# Patient Record
Sex: Male | Born: 1940 | Race: White | Hispanic: No | Marital: Single | Smoking: Former smoker
Health system: Southern US, Community
[De-identification: ages and names within clinical notes are randomized; demographics above are authoritative.]

## PROBLEM LIST (undated history)

## (undated) DIAGNOSIS — D649 Anemia, unspecified: Secondary | ICD-10-CM

## (undated) DIAGNOSIS — K219 Gastro-esophageal reflux disease without esophagitis: Secondary | ICD-10-CM

## (undated) DIAGNOSIS — E785 Hyperlipidemia, unspecified: Secondary | ICD-10-CM

## (undated) DIAGNOSIS — J449 Chronic obstructive pulmonary disease, unspecified: Secondary | ICD-10-CM

## (undated) DIAGNOSIS — G709 Myoneural disorder, unspecified: Secondary | ICD-10-CM

## (undated) DIAGNOSIS — F419 Anxiety disorder, unspecified: Secondary | ICD-10-CM

## (undated) DIAGNOSIS — I639 Cerebral infarction, unspecified: Secondary | ICD-10-CM

## (undated) DIAGNOSIS — I10 Essential (primary) hypertension: Secondary | ICD-10-CM

## (undated) HISTORY — DX: Anxiety disorder, unspecified: F41.9

## (undated) HISTORY — PX: NECK SURGERY: SHX720

## (undated) HISTORY — DX: Myoneural disorder, unspecified: G70.9

## (undated) HISTORY — DX: Hyperlipidemia, unspecified: E78.5

## (undated) HISTORY — PX: COLONOSCOPY: SHX174

## (undated) HISTORY — DX: Gastro-esophageal reflux disease without esophagitis: K21.9

## (undated) HISTORY — DX: Cerebral infarction, unspecified: I63.9

## (undated) HISTORY — PX: POLYPECTOMY: SHX149

## (undated) HISTORY — DX: Anemia, unspecified: D64.9

## (undated) HISTORY — DX: Essential (primary) hypertension: I10

## (undated) HISTORY — DX: Chronic obstructive pulmonary disease, unspecified: J44.9

## (undated) HISTORY — PX: KNEE SURGERY: SHX244

---

## 2013-01-10 ENCOUNTER — Institutional Professional Consult (permissible substitution): Payer: Self-pay | Admitting: Emergency Medicine

## 2013-01-16 ENCOUNTER — Ambulatory Visit (INDEPENDENT_AMBULATORY_CARE_PROVIDER_SITE_OTHER)
Admission: RE | Admit: 2013-01-16 | Discharge: 2013-01-16 | Disposition: A | Discharge status: Home / Self Care | Payer: Medicare Other | Source: Ambulatory Visit | Attending: Internal Medicine | Admitting: Internal Medicine

## 2013-01-16 ENCOUNTER — Ambulatory Visit (INDEPENDENT_AMBULATORY_CARE_PROVIDER_SITE_OTHER): Payer: Medicare Other | Admitting: Internal Medicine

## 2013-01-16 ENCOUNTER — Other Ambulatory Visit (INDEPENDENT_AMBULATORY_CARE_PROVIDER_SITE_OTHER): Payer: Medicare Other

## 2013-01-16 ENCOUNTER — Encounter: Payer: Self-pay | Admitting: Internal Medicine

## 2013-01-16 VITALS — BP 142/68 | HR 108 | Temp 97.9°F | Ht 73.0 in | Wt 216.0 lb

## 2013-01-16 DIAGNOSIS — E785 Hyperlipidemia, unspecified: Secondary | ICD-10-CM

## 2013-01-16 DIAGNOSIS — D509 Iron deficiency anemia, unspecified: Secondary | ICD-10-CM

## 2013-01-16 LAB — BASIC METABOLIC PANEL
CO2: 24 mEq/L (ref 19–32)
Chloride: 106 mEq/L (ref 96–112)
Glucose, Bld: 101 mg/dL — ABNORMAL HIGH (ref 70–99)
Potassium: 4.5 mEq/L (ref 3.5–5.1)
Sodium: 138 mEq/L (ref 135–145)

## 2013-01-16 LAB — CBC WITH DIFFERENTIAL/PLATELET
Eosinophils Relative: 2.3 % (ref 0.0–5.0)
HCT: 30.6 % — ABNORMAL LOW (ref 39.0–52.0)
Lymphs Abs: 1.9 10*3/uL (ref 0.7–4.0)
Monocytes Relative: 10 % (ref 3.0–12.0)
Platelets: 219 10*3/uL (ref 150.0–400.0)
WBC: 6.8 10*3/uL (ref 4.5–10.5)

## 2013-01-16 NOTE — Progress Notes (Signed)
  Subjective:    Patient ID: Nicholas Buck, male    DOB: 1941/04/30   MRN: [id number]  HPI   77 yowm quit smoking summer 2013 with dx of copd by Chodri self-referred to pulmonary clinic with progressive decline in activity tol since quit smoking.   01/16/2013 1st pulmonary eval cc progressive doe assoc wt gain 190 to 216  previously able to play 18 holes indolent onset progressively worse to point where can't do one do one hold and doe x 100 ft with leg and arm pain and fatigue and sense of pnds to point of sometimes choking esp at hs but then able to sleep all night.  No obvious daytime variabilty or assoc chronic cough or cp or chest tightness, subjective wheeze overt sinus or hb symptoms. No unusual exp hx or h/o childhood pna/ asthma or premature birth to his knowledge.   Also bothered by ? Peripheral neuropathy   Review of Systems  Constitutional: Positive for unexpected weight change. Negative for fever.  HENT: Positive for congestion. Negative for ear pain, nosebleeds, sore throat, rhinorrhea, sneezing, trouble swallowing, dental problem, postnasal drip and sinus pressure.   Eyes: Negative for redness and itching.  Respiratory: Positive for shortness of breath. Negative for cough, chest tightness and wheezing.   Cardiovascular: Negative for palpitations and leg swelling.  Gastrointestinal: Negative for nausea and vomiting.  Genitourinary: Negative for dysuria.  Musculoskeletal: Negative for joint swelling.  Skin: Negative for rash.  Neurological: Negative for headaches.  Hematological: Does not bruise/bleed easily.  Psychiatric/Behavioral: Negative for dysphoric mood. The patient is not nervous/anxious.        Objective:   Physical Exam Wt Readings from Last 3 Encounters:  01/16/13 216 lb (97.977 kg)    HEENT mild turbinate edema.  Oropharynx clear edentulous with dentures in place no thrush or excess pnd or cobblestoning.  No JVD or cervical adenopathy. Mild accessory muscle  hypertrophy. Trachea midline, nl thryroid. Chest was hyperinflated by percussion with diminished breath sounds and moderate increased exp time without wheeze. Hoover sign positive at mid inspiration. Regular rate and rhythm without murmur gallop or rub or increase P2 or edema.  Abd: no hsm, nl excursion. Ext warm without cyanosis or clubbing.    CXR  01/16/2013 :   COPD/chronic changes.  Labs 01/16/23:  Hct 30% with microcytic indices       Assessment & Plan:

## 2013-01-16 NOTE — Patient Instructions (Addendum)
Try off the cholesterol pill for at least a couple of weeks  Baby aspirin one daily with breakfast  Stop spiriva  Only use the nebulizer if having a bad day with breathing to see if it turns a bad day into a good one  Please remember to go to the lab and x-ray department downstairs for your tests - we will call you with the results when they are available.  Try prilosec 20mg   Take 30-60 min before first meal of the day and Pepcid 20 mg one bedtime   GERD (REFLUX)  is an extremely common cause of respiratory symptoms just like yours, many times with no significant heartburn at all.    It can be treated with medication, but also with lifestyle changes including avoidance of late meals, excessive alcohol, smoking cessation, and avoid fatty foods, chocolate, peppermint, colas, red wine, and acidic juices such as orange juice.  NO MINT OR MENTHOL PRODUCTS SO NO COUGH DROPS  USE SUGARLESS CANDY INSTEAD (jolley ranchers or Stover's)  NO OIL BASED VITAMINS - use powdered substitutes.      Please schedule a follow up office visit in 4 weeks, sooner if needed   You are cleared to participate in rehab Late add:  Cancel rehab until sort out anemia

## 2013-01-17 DIAGNOSIS — D509 Iron deficiency anemia, unspecified: Secondary | ICD-10-CM | POA: Insufficient documentation

## 2013-01-17 NOTE — Assessment & Plan Note (Signed)
Microcytic anemia worrisome for underlying GI ca and does already have some symptoms suggestive of GERD so will likely need full GI w/u

## 2013-01-17 NOTE — Assessment & Plan Note (Signed)
-   Spirometry 01/16/13 FEV1  3.79 (102%) ratio 65 -01/17/2013  Walked RA x 3 laps @ 185 ft each stopped due to end of study, mild sob with sats still 90%  He only has GOLD I severity - Symptoms are markedly disproportionate to objective findings and not clear this is a lung problem but pt does appear to have difficult airway management issues.   Adherence is always the initial "prime suspect" and is a multilayered concern that requires a "trust but verify" approach in every patient - starting with knowing how to use medications, especially inhalers, correctly, keeping up with refills and understanding the fundamental difference between maintenance and prns vs those medications only taken for a very short course and then stopped and not refilled. Clearly not using spiriva correctly and probably not helping anyway so d/c it and just use duoneb prn (See instructions for specific recommendations which were reviewed directly with the patient who was given a copy with highlighter outlining the key components. )  ? Acid reflux suggested by the choking symptoms> max rx plus diet reviewed

## 2013-01-20 ENCOUNTER — Other Ambulatory Visit: Payer: Self-pay | Admitting: Internal Medicine

## 2013-01-20 NOTE — Progress Notes (Signed)
Quick Note:  Spoke with pt and notified of results per Dr. Wert. Pt verbalized understanding and denied any questions.  ______ 

## 2013-01-21 ENCOUNTER — Encounter: Payer: Self-pay | Admitting: Internal Medicine

## 2013-01-21 ENCOUNTER — Other Ambulatory Visit: Payer: Self-pay | Admitting: Internal Medicine

## 2013-01-21 ENCOUNTER — Encounter (HOSPITAL_COMMUNITY): Admission: RE | Admit: 2013-01-21 | Payer: Medicare Other | Source: Ambulatory Visit

## 2013-01-21 ENCOUNTER — Telehealth: Payer: Self-pay | Admitting: Internal Medicine

## 2013-01-21 ENCOUNTER — Ambulatory Visit (INDEPENDENT_AMBULATORY_CARE_PROVIDER_SITE_OTHER): Payer: Medicare Other | Admitting: Internal Medicine

## 2013-01-21 ENCOUNTER — Other Ambulatory Visit (INDEPENDENT_AMBULATORY_CARE_PROVIDER_SITE_OTHER): Payer: Medicare Other

## 2013-01-21 ENCOUNTER — Other Ambulatory Visit: Payer: Medicare Other

## 2013-01-21 VITALS — BP 124/70 | HR 101 | Temp 97.7°F | Ht 74.0 in | Wt 215.0 lb

## 2013-01-21 DIAGNOSIS — D62 Acute posthemorrhagic anemia: Secondary | ICD-10-CM

## 2013-01-21 DIAGNOSIS — J449 Chronic obstructive pulmonary disease, unspecified: Secondary | ICD-10-CM

## 2013-01-21 DIAGNOSIS — D649 Anemia, unspecified: Secondary | ICD-10-CM

## 2013-01-21 DIAGNOSIS — R0602 Shortness of breath: Secondary | ICD-10-CM

## 2013-01-21 DIAGNOSIS — D509 Iron deficiency anemia, unspecified: Secondary | ICD-10-CM

## 2013-01-21 DIAGNOSIS — R0600 Dyspnea, unspecified: Secondary | ICD-10-CM

## 2013-01-21 DIAGNOSIS — R002 Palpitations: Secondary | ICD-10-CM

## 2013-01-21 LAB — IBC PANEL
Saturation Ratios: 5.2 % — ABNORMAL LOW (ref 20.0–50.0)
Transferrin: 370.4 mg/dL — ABNORMAL HIGH (ref 212.0–360.0)

## 2013-01-21 LAB — CBC
Platelets: 226 10*3/uL (ref 150.0–400.0)
RBC: 4.19 Mil/uL — ABNORMAL LOW (ref 4.22–5.81)
WBC: 6.7 10*3/uL (ref 4.5–10.5)

## 2013-01-21 NOTE — Telephone Encounter (Signed)
We can reach the son at [phone]. The pt and son would like to know if there is anything the pt should do if he starts having the weakness  or dizziness again while waiting on appt with GI on 02/05/13. Should he be taking any vitamins or eating a special diet? Also, is his symptoms and blood loss related or could this be two separate issues? MW, pls advise.

## 2013-01-21 NOTE — Progress Notes (Signed)
Quick Note:  Spoke with pt and notified of results per Dr. Wert. Pt verbalized understanding and denied any questions.  ______ 

## 2013-01-21 NOTE — Telephone Encounter (Signed)
Notes Recorded by Nyoka Cowden, MD on 01/21/2013 at 2:47 PM Call patient : Study is not changed, so no change in recs from ov      ATC NA, and mailbox full so unable to leave a msg, [facility]

## 2013-01-21 NOTE — Assessment & Plan Note (Addendum)
-     01/21/2013   Occult stools POS ' - Fe Sat 5% > refer to GI and d/c asa/ continue max acid suppression

## 2013-01-21 NOTE — Assessment & Plan Note (Signed)
-   Spirometry 01/16/13 FEV1  3.79 (102%) ratio 65 -01/17/2013  Walked RA x 3 laps @ 185 ft each stopped due to end of study, mild sob with sats still 90%  Sob likely related to anemia and anxiety but no need for further saba rx > rec  use only in emergency

## 2013-01-21 NOTE — Telephone Encounter (Signed)
ATC x 1. Mailbox full. WCB.

## 2013-01-21 NOTE — Telephone Encounter (Signed)
Pt and son aware of MW recs and verbalized understanding. They will call if any of the pt's symptoms change or become worse.

## 2013-01-21 NOTE — Telephone Encounter (Signed)
This is a chronic stable  problem and off asa should be much better over several weeks if continues gerd rx and would not rx with any vitamins or iron as it will interfere with w/u

## 2013-01-21 NOTE — Telephone Encounter (Signed)
Pt's son returned call.  He can be reached @ same # we called before. Nicholas Buck

## 2013-01-21 NOTE — Patient Instructions (Addendum)
If condition worsens you will need to go directly to ER  You appear to have anemia due to possible blood loss so at this point should stop your aspirin and ok restart cholesterol pill unless you notice the aches and pains in shoulders recur in which case you should stop   Please see patient coordinator before you leave today  to schedule GI referral  Please remember to go to the lab   department downstairs for your tests - we will call you with the results when they are available. Late add :  Cancel stool cards as G pos in office

## 2013-01-21 NOTE — Progress Notes (Signed)
Subjective:    Patient ID: Nicholas Buck, male    DOB: July 28, 1941   MRN: [id number]  HPI   67 yowm quit smoking summer 2013 with dx of copd by Chodri self-referred to pulmonary clinic with progressive decline in activity tol since quit smoking.   01/16/2013 1st pulmonary eval cc progressive doe assoc wt gain 190 to 216  previously able to play 18 holes indolent onset progressively worse to point where can't do one do one hole and doe x 100 ft with leg and arm pain and fatigue and sense of pnds to point of sometimes choking esp at hs but then able to sleep all night. rec Try off the cholesterol pill for at least a couple of weeks Baby aspirin one daily with breakfast Stop spiriva Only use the nebulizer if having a bad day with breathing to see if it turns a bad day into a good one Please remember to go to the lab and x-ray department downstairs for your tests - we will call you with the results when they are available. Try prilosec 20mg   Take 30-60 min before first meal of the day and Pepcid 20 mg one bedtime  GERD diet Labs :  Pos Microcyctic anemia.    01/21/2013  Acute w/in  ov/Princesa Willig cc acute onset feeling shaky p neb use around 2 h prior to OV  For blood draw for fe levels so seen as acute w/in. No cp - arm discomfort better off statin, still taking baby asa with no obvious gi or other blood loss.  No sob at rest   No obvious daytime variabilty or assoc chronic cough or cp or chest tightness, subjective wheeze overt sinus or hb symptoms. No unusual exp hx or h/o childhood pna/ asthma or premature birth to his knowledge.   Sleeping ok without nocturnal  or early am exacerbation  of respiratory  c/o's or need for noct saba. Also denies any obvious fluctuation of symptoms with weather or environmental changes or other aggravating or alleviating factors except as outlined above   ROS  The following are not active complaints unless bolded sore throat, dysphagia, dental problems, itching,  sneezing,  nasal congestion or excess/ purulent secretions, ear ache,   fever, chills, sweats, unintended wt loss, pleuritic or exertional cp, hemoptysis,  orthopnea pnd or leg swelling, presyncope, palpitations, heartburn, abdominal pain, anorexia, nausea, vomiting, diarrhea  or change in bowel or urinary habits, change in stools or urine, dysuria,hematuria,  rash, arthralgias, visual complaints, headache, numbness weakness or ataxia or problems with walking or coordination,  change in mood/affect or memory.              Objective:   Physical Exam Wt 01/21/2013  215 Wt Readings from Last 3 Encounters:  01/16/13 216 lb (97.977 kg)    HEENT mild turbinate edema.  Oropharynx clear edentulous with dentures in place no thrush or excess pnd or cobblestoning.  No JVD or cervical adenopathy. Mild accessory muscle hypertrophy. Trachea midline, nl thryroid. Chest was hyperinflated by percussion with diminished breath sounds and moderate increased exp time without wheeze. Hoover sign positive at mid inspiration. Regular rate and rhythm without murmur gallop or rub or increase P2 or edema.  Abd: no hsm, nl excursion. Ext warm without cyanosis or clubbing.  Rectal POS OCCULT BLOOD, no mass or prostate nodule  CXR  01/16/2013 :   COPD/chronic changes.  Labs 01/16/23:  Hct 30% with microcytic indices Repeat 01/21/2013 : Hct still 30%, Fesat 5.2%  01/21/2013 EKG nsr/ wnl    Assessment & Plan:

## 2013-01-22 ENCOUNTER — Other Ambulatory Visit (INDEPENDENT_AMBULATORY_CARE_PROVIDER_SITE_OTHER): Payer: Medicare Other

## 2013-01-22 ENCOUNTER — Telehealth: Payer: Self-pay | Admitting: Internal Medicine

## 2013-01-22 DIAGNOSIS — R0602 Shortness of breath: Secondary | ICD-10-CM

## 2013-01-22 NOTE — Telephone Encounter (Signed)
Spoke with pt's son He had multiple questions for MW- what is his prognosis with his "lung problem"  What should he do while waiting to see GI to prevent any further blood loss and why is he losing so much blood?? I advised that we stopped his asa and dare not sure about what the cause is and this is the reason for GI referral  He does not understand why we are not giving any other recs and wants MW to call him Please call him or advise thanks!

## 2013-01-22 NOTE — Telephone Encounter (Signed)
Discussed with pt by phone - he's feeling better today and would prefer [place] not to discuss details of his case with his son in pt's absence

## 2013-01-22 NOTE — Progress Notes (Signed)
Quick Note:  Pt aware of these results ______

## 2013-01-23 ENCOUNTER — Ambulatory Visit (HOSPITAL_COMMUNITY): Payer: Self-pay

## 2013-01-28 ENCOUNTER — Ambulatory Visit (HOSPITAL_COMMUNITY): Payer: Self-pay

## 2013-01-30 ENCOUNTER — Ambulatory Visit (HOSPITAL_COMMUNITY): Payer: Self-pay

## 2013-02-04 ENCOUNTER — Ambulatory Visit (HOSPITAL_COMMUNITY): Payer: Self-pay

## 2013-02-05 ENCOUNTER — Ambulatory Visit (INDEPENDENT_AMBULATORY_CARE_PROVIDER_SITE_OTHER): Payer: Medicare Other | Admitting: Internal Medicine

## 2013-02-05 ENCOUNTER — Encounter: Payer: Self-pay | Admitting: Internal Medicine

## 2013-02-05 VITALS — BP 140/70 | HR 109 | Ht 72.6 in | Wt 217.0 lb

## 2013-02-05 MED ORDER — FERROUS SULFATE 325 (65 FE) MG PO TBEC: 325.0000 mg | DELAYED_RELEASE_TABLET | Freq: Three times a day (TID) | ORAL | Status: DC

## 2013-02-05 MED ORDER — MOVIPREP 100 G PO SOLR: 1.0000 | Freq: Once | ORAL | Status: DC

## 2013-02-05 NOTE — Progress Notes (Signed)
HISTORY OF PRESENT ILLNESS:  Nicholas Buck is a 72 y.o. male with hyperlipidemia who is sent today for evaluation regarding iron deficiency anemia and Hemoccult-positive stool. Patient reports that he was in his usual state of health until last summer when he developed progressive dyspnea on exertion. He was seen elsewhere in diagnosed with COPD. Subsequently evaluated by our pulmonary team and it was decided that he did not have significant COPD. However, he was noted to be significantly anemic with hematocrit of 30%, microcytic indices, and iron saturation of 5.2%. Rectal examination revealed Hemoccult-positive stool. He was empirically placed on PPI and H2 receptor antagonists. He quit smoking. He denies melena or hematochezia. GI review of systems negative except for weight gain. The patient states that he had colonoscopy in 2010 and was told that he has polyps. He says this was his second colonoscopy and it was recommended for followup in 5 years. No records available at the time of his visit. Subsequently, records were obtained and summarized as follows: All by Dr. Marcial Pacas Misenheimer: Colonoscopy 01/18/2007. Significant sigmoid colon polyp removed piecemeal in the sigmoid colon with tattooing (hyperplastic) an additional 6 diminutive polyps. 3 tubular adenomas and hyperplastic polyps noted. Colonoscopy 01/28/2008 revealing redundant tissue at the polypectomy site as well as 10 diminutive polyps and diverticular disease. Pathology revealed hyperplastic polyps and no pathologic diagnosis. No adenomatous tissue. Colonoscopy 06/01/2009 to evaluate iron deficiency anemia. 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 Polyp was hyperplastic. EGD 06/01/2009 was normal except for diminutive gastric polyp which was unremarkable on biopsy. Duodenal biopsies were normal. He does take a daily aspirin and rarely uses  NSAIDs  REVIEW OF SYSTEMS:  All non-GI ROS negative except for fatigue, exertional shortness of breath, excessive urination, back pain  Past Medical History  Diagnosis Date  . Anemia     microcytic  . COPD (chronic obstructive pulmonary disease)     ?  Marland Kitchen Hyperlipidemia   . GERD (gastroesophageal reflux disease)     History reviewed. No pertinent past surgical history.  Social History Nicholas Buck  reports that he has quit smoking. His smoking use included Cigarettes. He started smoking about 7 months ago. He has a 50 pack-year smoking history. He has never used smokeless tobacco. He reports that  drinks alcohol. He reports that he does not use illicit drugs.  family history is not on file.  No Known Allergies     PHYSICAL EXAM INATION: Vital signs: BP 140/70  Pulse 109  Ht 6' 0.6" (1.844 m)  Wt 217 lb (98.431 kg)  BMI 28.95 kg/m2  SpO2 91%  Constitutional: generally well-appearing, no acute distress Psychiatric: alert and oriented x3, cooperative Eyes: extraocular movements intact, anicteric, conjunctiva pink Mouth: oral pharynx moist, no lesions Neck: supple no lymphadenopathy Cardiovascular: heart regular rate and rhythm, no murmur Lungs: clear to auscultation bilaterally Abdomen: soft, nontender, nondistended, no obvious ascites, no peritoneal signs, normal bowel sounds, no organomegaly Rectal: Deferred until colonoscopy Extremities: no lower extremity edema bilaterally Skin: no lesions on visible extremities Neuro: No focal deficits. No asterixis.    ASSESSMENT:  #1. Iron deficiency anemia. History of the same. Suspect AVMs #2. Significant history of polyposis. Last colonoscopy 2010 #3. Prior upper endoscopy with gastric polyp. Now empirically On PPI   PLAN:  #1. Iron sulfate 325 mg 3 times a day. Warned about darkening stools and constipation #2. Colonoscopy and upper endoscopy.The nature of the procedure,  as well as the risks, benefits, and  alternatives were carefully and thoroughly reviewed with the patient. Ample time for discussion and questions allowed. The patient understood, was satisfied, and agreed to proceed. #3. Hold iron 5-7 days prior to procedure #4. Continue PPI for now #5. Movi prep prescribed. Patient instructed on its use.

## 2013-02-05 NOTE — Patient Instructions (Addendum)
You have been scheduled for an endoscopy and colonoscopy with propofol. Please follow the written instructions given to you at your visit today. Please pick up your prep at the pharmacy within the next 1-3 days. If you use inhalers (even only as needed), please bring them with you on the day of your procedure.   We have sent the following medications to your pharmacy for you to pick up at your convenience:  Iron Sulfate  Please stop taking the Iron 5 days prior to your procedure

## 2013-02-06 ENCOUNTER — Ambulatory Visit (HOSPITAL_COMMUNITY): Payer: Self-pay

## 2013-02-11 ENCOUNTER — Ambulatory Visit (HOSPITAL_COMMUNITY): Payer: Self-pay

## 2013-02-13 ENCOUNTER — Ambulatory Visit (HOSPITAL_COMMUNITY): Payer: Self-pay

## 2013-02-13 ENCOUNTER — Ambulatory Visit (INDEPENDENT_AMBULATORY_CARE_PROVIDER_SITE_OTHER): Payer: Medicare Other | Admitting: Internal Medicine

## 2013-02-13 ENCOUNTER — Encounter: Payer: Self-pay | Admitting: Internal Medicine

## 2013-02-13 VITALS — BP 130/80 | HR 105 | Temp 97.1°F | Ht 74.0 in | Wt 214.0 lb

## 2013-02-13 NOTE — Assessment & Plan Note (Addendum)
-   Spirometry 01/16/13 FEV1  3.79 (102%) ratio 65 -01/17/2013  Walked RA x 3 laps @ 185 ft each stopped due to end of study, mild sob with sats still 90%  I had an extended summary discussion with the patient today lasting 15 to 20 minutes of a 25 minute visit on the following issues:   As I explained to this patient in detail:  although there may be significant copd present, it does not appear to be limiting activity tolerance any more than a set of worn tires limits someone from driving a car  around a parking lot.  A new set of Michelins might look good but would have no perceived impact on the performance of the car and would not be worth the cost. At this point he is limited by anemia and deconditioning but is improving with rx of anemia.  We can certainly proceed with a cpst p anemia is corrected if pt not convinced his breathing is completely back to baseline but no further f/u needed at this point

## 2013-02-13 NOTE — Progress Notes (Signed)
Subjective:    Patient ID: Nicholas Buck, male    DOB: Mar 08, 1941   MRN: [id number]     Brief patient profile:  71 yowm quit smoking summer 2013 with dx of copd by Chodri self-referred to pulmonary clinic with progressive decline in activity tol since quit smoking.   01/16/2013 1st pulmonary eval cc progressive doe assoc wt gain 190 to 216  previously able to play 18 holes indolent onset progressively worse to point where can't do one do one hole and doe x 100 ft with leg and arm pain and fatigue and sense of pnds to point of sometimes choking esp at hs but then able to sleep all night. rec Try off the cholesterol pill for at least a couple of weeks Baby aspirin one daily with breakfast Stop spiriva Only use the nebulizer if having a bad day with breathing to see if it turns a bad day into a good one Please remember to go to the lab and x-ray department downstairs for your tests - we will call you with the results when they are available. Try prilosec 20mg   Take 30-60 min before first meal of the day and Pepcid 20 mg one bedtime  GERD diet Labs :  Pos Microcyctic anemia.    01/21/2013  Acute w/in  ov/Logen Heintzelman cc acute onset feeling shaky p neb use around 2 h prior to OV  For blood draw for fe levels so seen as acute w/in. No cp - arm discomfort better off statin, still taking baby asa with no obvious gi or other blood loss.  No sob at rest  02/13/2013 f/u ov/Doyle Kunath cc breathing a little better on Fe rx and not using any inhalers or nebs or dizzy spells.  No obvious daytime variabilty or assoc chronic cough or cp or chest tightness, subjective wheeze overt sinus or hb symptoms. No unusual exp hx or h/o childhood pna/ asthma or premature birth to his knowledge.   Sleeping ok without nocturnal  or early am exacerbation  of respiratory  c/o's or need for noct saba. Also denies any obvious fluctuation of symptoms with weather or environmental changes or other aggravating or alleviating factors except as  outlined above   ROS  The following are not active complaints unless bolded sore throat, dysphagia, dental problems, itching, sneezing,  nasal congestion or excess/ purulent secretions, ear ache,   fever, chills, sweats, unintended wt loss, pleuritic or exertional cp, hemoptysis,  orthopnea pnd or leg swelling, presyncope, palpitations, heartburn, abdominal pain, anorexia, nausea, vomiting, diarrhea  or change in bowel or urinary habits, change in stools or urine, dysuria,hematuria,  rash, arthralgias, visual complaints, headache, numbness weakness or ataxia or problems with walking or coordination,  change in mood/affect or memory.              Objective:   Physical Exam Wt 01/21/2013  215 Wt Readings from Last 3 Encounters:  01/16/13 216 lb (97.977 kg)    HEENT mild turbinate edema.  Oropharynx clear edentulous with dentures in place no thrush or excess pnd or cobblestoning.  No JVD or cervical adenopathy. Mild accessory muscle hypertrophy. Trachea midline, nl thryroid. Chest was hyperinflated by percussion with diminished breath sounds and moderate increased exp time without wheeze. Hoover sign positive at mid inspiration. Regular rate and rhythm without murmur gallop or rub or increase P2 or edema.  Abd: no hsm, nl excursion. Ext warm without cyanosis Pos moderate clubbing.  Rectal POS OCCULT BLOOD, no mass or prostate nodule  CXR  01/16/2013 :   COPD/chronic changes.  Labs 01/16/23:  Hct 30% with microcytic indices Repeat 01/21/2013 : Hct still 30%, Fesat 5.2%   01/21/2013 EKG nsr/ wnl    Assessment & Plan:

## 2013-02-13 NOTE — Patient Instructions (Addendum)
If condition worsens you will need to go directly to ER  You appear to have anemia due to possible blood loss so at this point should continue off  your aspirin and ok restart cholesterol pill unless you notice the aches and pains in shoulders recur in which case you should stop permanently  . If you are satisfied with your treatment plan let your doctor know and he/she can either refill your medications or you can return here when your prescription runs out.     If in any way you are not 100% satisfied,  please tell us.  If 100% better, tell your friends!   If not satisfied the next step after correct your anemia is to schedule a cpst - call Libby [phone] to schedule.

## 2013-02-13 NOTE — Assessment & Plan Note (Addendum)
-    01/21/2013   Occult stools POS at ov -  01/21/2013  Fe Sat 5% > refer to GI> seen 3/12 >dx prob avms, egd/colonoscopy planned   Lab Results  Component Value Date   HGB 9.9* 01/21/2013

## 2013-02-15 DIAGNOSIS — E785 Hyperlipidemia, unspecified: Secondary | ICD-10-CM | POA: Insufficient documentation

## 2013-02-15 NOTE — Assessment & Plan Note (Signed)
Aches are gone off statins but not clear if cause and effect > rec rechallenge with statins and f/u Dr Annell Greening

## 2013-02-18 ENCOUNTER — Ambulatory Visit (HOSPITAL_COMMUNITY): Payer: Self-pay

## 2013-02-20 ENCOUNTER — Ambulatory Visit (HOSPITAL_COMMUNITY): Payer: Self-pay

## 2013-02-25 ENCOUNTER — Ambulatory Visit (HOSPITAL_COMMUNITY): Payer: Self-pay

## 2013-02-26 ENCOUNTER — Ambulatory Visit (AMBULATORY_SURGERY_CENTER): Payer: Medicare Other | Admitting: Internal Medicine

## 2013-02-26 ENCOUNTER — Encounter: Payer: Self-pay | Admitting: Internal Medicine

## 2013-02-26 VITALS — BP 144/91 | HR 74 | Temp 98.2°F | Resp 20 | Ht 72.0 in | Wt 217.0 lb

## 2013-02-26 DIAGNOSIS — D509 Iron deficiency anemia, unspecified: Secondary | ICD-10-CM

## 2013-02-26 DIAGNOSIS — D126 Benign neoplasm of colon, unspecified: Secondary | ICD-10-CM

## 2013-02-26 DIAGNOSIS — Z860100 Personal history of colon polyps, unspecified: Secondary | ICD-10-CM

## 2013-02-26 DIAGNOSIS — R195 Other fecal abnormalities: Secondary | ICD-10-CM

## 2013-02-26 MED ORDER — SODIUM CHLORIDE 0.9 % IV SOLN: 500.0000 mL | INTRAVENOUS | Status: DC

## 2013-02-26 NOTE — Progress Notes (Signed)
Called to room to assist during endoscopic procedure.  Patient ID and intended procedure confirmed with present staff. Received instructions for my participation in the procedure from the performing physician.  

## 2013-02-26 NOTE — Op Note (Signed)
 [facility] [address] [place], [address]   COLONOSCOPY PROCEDURE REPORT  PATIENT: Nicholas Buck, Nicholas Buck  MR#: [id number] BIRTHDATE: 1941-04-01 , 71  yrs. old GENDER: Male ENDOSCOPIST: Roxy Cedar, MD REFERRED XB:JYNWGNF Denice Paradise, M.D. PROCEDURE DATE:  02/26/2013 PROCEDURE:   Colonoscopy with snare polypectomy  x 2 ASA CLASS:   Class II INDICATIONS:Iron Deficiency Anemia, heme-positive stool, and Patient's personal history of adenomatous colon polyps.Elsewhere 2008, 2009, 2010 MEDICATIONS: MAC sedation, administered by CRNA and propofol (Diprivan) 200mg  IV  DESCRIPTION OF PROCEDURE:   After the risks benefits and alternatives of the procedure were thoroughly explained, informed consent was obtained.  A digital rectal exam revealed no abnormalities of the rectum.   The LB CF-H180AL [id number]  endoscope was introduced through the anus and advanced to the cecum, which was identified by both the appendix and ileocecal valve. No adverse events experienced.   The quality of the prep was excellent, using MoviPrep  The instrument was then slowly withdrawn as the colon was fully examined.      COLON FINDINGS: Two diminutive polyps were found in the ascending colon.  A polypectomy was performed with a cold snare.  The resection was complete and the polyp tissue was completely retrieved.   Moderate diverticulosis was noted The finding was in the left colon. Previously placed marking tattoo in the sigmoid colon at 25 cm noted.  The colon mucosa was otherwise normal. No obvious AVMs.  Retroflexed views revealed internal hemorrhoids. The time to cecum=2 minutes 13 seconds.  Withdrawal time=10 minutes 42 seconds.  The scope was withdrawn and the procedure completed. COMPLICATIONS: There were no complications.  ENDOSCOPIC IMPRESSION: 1.   Two diminutive polyps were found in the ascending colon; polypectomy was performed with a cold snare 2.   Moderate diverticulosis was  noted in the left colon 3.   The colon mucosa was otherwise normal  RECOMMENDATIONS: 1.  Follow up colonoscopy in 5 years 2.  Upper endoscopy today (see report) 3. Stay on iron therapy indefinitely. Had your blood counts checkedin the next week or so.   eSigned:  Roxy Cedar, MD 02/26/2013 4:22 PM   cc: The Patient and Nyoka Cowden, MD   PATIENT NAME:  [name], [name] MR#: [id number]

## 2013-02-26 NOTE — Patient Instructions (Addendum)

## 2013-02-26 NOTE — Progress Notes (Signed)
NO EGG OR SOY ALLERGY. EWM 

## 2013-02-26 NOTE — Progress Notes (Signed)
Patient did not experience any of the following events: a burn prior to discharge; a fall within the facility; wrong site/side/patient/procedure/implant event; or a hospital transfer or hospital admission upon discharge from the facility. (G8907) Patient did not have preoperative order for IV antibiotic SSI prophylaxis. (G8918)  

## 2013-02-26 NOTE — Op Note (Signed)
 [facility] [address] [place], [address]   ENDOSCOPY PROCEDURE REPORT  PATIENT: Nicholas Buck, Nicholas Buck  MR#: [id number] BIRTHDATE: [birth year] , 71  yrs. old GENDER: Male ENDOSCOPIST: Roxy Cedar, MD REFERRED BY:  Nyoka Cowden, M.D. PROCEDURE DATE:  02/26/2013 PROCEDURE:  EGD, diagnostic ASA CLASS:     Class II INDICATIONS:  Iron deficiency anemia.   Heme positive stool. MEDICATIONS: MAC sedation, administered by CRNA and propofol (Diprivan) 100mg  IV TOPICAL ANESTHETIC: none  DESCRIPTION OF PROCEDURE: After the risks benefits and alternatives of the procedure were thoroughly explained, informed consent was obtained.  The LB GIF-H180 [id number] endoscope was introduced through the mouth and advanced to the third portion of the duodenum. Without limitations.  The instrument was slowly withdrawn as the mucosa was fully examined.      The upper, middle and distal third of the esophagus were carefully inspected and no abnormalities were noted.  The z-line was well seen at the GEJ.  The endoscope was pushed into the fundus which was normal including a retroflexed view.  The antrum, gastric body, first and second part of the duodenum were unremarkable. Retroflexed views revealed no abnormalities.     The scope was then withdrawn from the patient and the procedure completed.  COMPLICATIONS: There were no complications. ENDOSCOPIC IMPRESSION: 1.Normal EGD  RECOMMENDATIONS: 1.Continue iron indefinitely 2. Have your primary provider followup for blood counts  REPEAT EXAM:  eSigned:  Roxy Cedar, MD 02/26/2013 4:28 PM   XB:JYNWGNF Denice Paradise, MD and The Patient

## 2013-02-27 ENCOUNTER — Telehealth: Payer: Self-pay | Admitting: *Deleted

## 2013-02-27 ENCOUNTER — Ambulatory Visit (HOSPITAL_COMMUNITY): Payer: Self-pay

## 2013-02-27 NOTE — Telephone Encounter (Signed)
  Follow up Call-  Call back number 02/26/2013  Post procedure Call Back phone  # [phone]  Permission to leave phone message Yes     Patient questions:  Do you have a fever, pain , or abdominal swelling? no Pain Score  0 *  Have you tolerated food without any problems? yes  Have you been able to return to your normal activities? yes  Do you have any questions about your discharge instructions: Diet   no Medications  no Follow up visit  no  Do you have questions or concerns about your Care? no  Actions: * If pain score is 4 or above: No action needed, pain <4.

## 2013-03-03 ENCOUNTER — Other Ambulatory Visit: Payer: Self-pay | Admitting: Internal Medicine

## 2013-03-03 ENCOUNTER — Telehealth: Payer: Self-pay | Admitting: Internal Medicine

## 2013-03-03 DIAGNOSIS — D649 Anemia, unspecified: Secondary | ICD-10-CM

## 2013-03-03 NOTE — Telephone Encounter (Signed)
Pt states that his PCP told him he would need an order for labs from our office. Let pt know Dr. Marina Goodell was returning his care to his PCP and they were to monitor his labs. Pt states that he is living in [place] now and wants to know if we will refer him to a PCP in [place]. Please advise.

## 2013-03-03 NOTE — Telephone Encounter (Signed)
Referral entered into epic for referral to PCP. Pt aware and instructed to call them in a few days if he has not heard back from them regarding an appt.

## 2013-03-03 NOTE — Telephone Encounter (Signed)
Refer him to a [facility] primary care who is accepting inpatients. Thanks

## 2013-03-04 ENCOUNTER — Ambulatory Visit (HOSPITAL_COMMUNITY): Payer: Self-pay

## 2013-03-04 ENCOUNTER — Encounter: Payer: Self-pay | Admitting: Internal Medicine

## 2013-03-06 ENCOUNTER — Ambulatory Visit (HOSPITAL_COMMUNITY): Payer: Self-pay

## 2013-03-11 ENCOUNTER — Telehealth: Payer: Self-pay | Admitting: Internal Medicine

## 2013-03-11 ENCOUNTER — Ambulatory Visit (HOSPITAL_COMMUNITY): Payer: Self-pay

## 2013-03-11 DIAGNOSIS — D509 Iron deficiency anemia, unspecified: Secondary | ICD-10-CM

## 2013-03-11 NOTE — Telephone Encounter (Signed)
Left message for patient to call back  

## 2013-03-12 NOTE — Telephone Encounter (Signed)
Have it checked here then, now. Thanks

## 2013-03-12 NOTE — Telephone Encounter (Signed)
Left message for patient to call back  

## 2013-03-12 NOTE — Telephone Encounter (Signed)
Patient advised He will come for the lab on Monday, he is going out of town for [employer]

## 2013-03-12 NOTE — Telephone Encounter (Signed)
Dr. Marina Goodell, you requested this patient have a CBC a week after his Colon.  He is establishing with a primary care on 04/18/13.  Can he wait until then or do you want him to come here prior?

## 2013-03-13 ENCOUNTER — Ambulatory Visit (HOSPITAL_COMMUNITY): Payer: Self-pay

## 2013-03-17 ENCOUNTER — Other Ambulatory Visit: Payer: Self-pay | Admitting: Internal Medicine

## 2013-03-17 ENCOUNTER — Other Ambulatory Visit (INDEPENDENT_AMBULATORY_CARE_PROVIDER_SITE_OTHER): Payer: Medicare Other

## 2013-03-17 DIAGNOSIS — D649 Anemia, unspecified: Secondary | ICD-10-CM

## 2013-03-17 DIAGNOSIS — D509 Iron deficiency anemia, unspecified: Secondary | ICD-10-CM

## 2013-03-17 LAB — CBC WITH DIFFERENTIAL/PLATELET
Basophils Relative: 0.6 % (ref 0.0–3.0)
Eosinophils Absolute: 0.2 10*3/uL (ref 0.0–0.7)
Eosinophils Relative: 3.7 % (ref 0.0–5.0)
HCT: 41.2 % (ref 39.0–52.0)
Lymphs Abs: 1.7 10*3/uL (ref 0.7–4.0)
MCHC: 33.1 g/dL (ref 30.0–36.0)
MCV: 87.4 fl (ref 78.0–100.0)
Monocytes Absolute: 0.4 10*3/uL (ref 0.1–1.0)
RBC: 4.71 Mil/uL (ref 4.22–5.81)
WBC: 5.4 10*3/uL (ref 4.5–10.5)

## 2013-03-18 ENCOUNTER — Ambulatory Visit (HOSPITAL_COMMUNITY): Payer: Self-pay

## 2013-03-20 ENCOUNTER — Ambulatory Visit (HOSPITAL_COMMUNITY): Payer: Self-pay

## 2013-03-25 ENCOUNTER — Ambulatory Visit (HOSPITAL_COMMUNITY): Payer: Self-pay

## 2013-03-27 ENCOUNTER — Ambulatory Visit (HOSPITAL_COMMUNITY): Payer: Self-pay

## 2013-04-01 ENCOUNTER — Ambulatory Visit (HOSPITAL_COMMUNITY): Payer: Self-pay

## 2013-04-03 ENCOUNTER — Ambulatory Visit (HOSPITAL_COMMUNITY): Payer: Self-pay

## 2013-04-08 ENCOUNTER — Ambulatory Visit (HOSPITAL_COMMUNITY): Payer: Self-pay

## 2013-04-10 ENCOUNTER — Ambulatory Visit (HOSPITAL_COMMUNITY): Payer: Self-pay

## 2013-04-15 ENCOUNTER — Ambulatory Visit (HOSPITAL_COMMUNITY): Payer: Self-pay

## 2013-04-18 ENCOUNTER — Ambulatory Visit: Payer: Medicare Other | Admitting: Internal Medicine

## 2013-04-18 ENCOUNTER — Ambulatory Visit (INDEPENDENT_AMBULATORY_CARE_PROVIDER_SITE_OTHER): Payer: Medicare Other | Admitting: Internal Medicine

## 2013-04-18 ENCOUNTER — Encounter: Payer: Self-pay | Admitting: Internal Medicine

## 2013-04-18 VITALS — BP 126/70 | HR 83 | Temp 97.6°F | Resp 16 | Ht 73.0 in | Wt 214.0 lb

## 2013-04-18 DIAGNOSIS — Z0000 Encounter for general adult medical examination without abnormal findings: Secondary | ICD-10-CM

## 2013-04-18 DIAGNOSIS — E785 Hyperlipidemia, unspecified: Secondary | ICD-10-CM

## 2013-04-18 DIAGNOSIS — R739 Hyperglycemia, unspecified: Secondary | ICD-10-CM | POA: Insufficient documentation

## 2013-04-18 DIAGNOSIS — R7309 Other abnormal glucose: Secondary | ICD-10-CM

## 2013-04-18 NOTE — Assessment & Plan Note (Signed)
No problems noted with the statin He has risk factors for CAD so I advised him to cont taking it I have NO prior records on him and he is a poor historian  - he refused a complete physical today  - he refused labs today  - he refused any vaccines today  I have requested records from his MD in [place]

## 2013-04-18 NOTE — Progress Notes (Signed)
  Subjective:    Patient ID: Nicholas Buck, male    DOB: [birth year], 72 y.o.   MRN: [id number]  Hyperlipidemia This is a chronic problem. The current episode started more than 1 year ago. The problem is controlled. Recent lipid tests were reviewed and are variable. Exacerbating diseases include obesity. He has no history of chronic renal disease, diabetes, hypothyroidism, liver disease or nephrotic syndrome. Factors aggravating his hyperlipidemia include smoking. Pertinent negatives include no chest pain, focal sensory loss, focal weakness, leg pain, myalgias or shortness of breath. Current antihyperlipidemic treatment includes statins. The current treatment provides moderate improvement of lipids. Compliance problems include adherence to exercise and adherence to diet.       Review of Systems  Constitutional: Negative.   HENT: Negative.   Eyes: Negative.   Respiratory: Negative.  Negative for choking, chest tightness, shortness of breath, wheezing and stridor.   Cardiovascular: Negative.  Negative for chest pain, palpitations and leg swelling.  Gastrointestinal: Negative.  Negative for abdominal pain, diarrhea and constipation.  Endocrine: Negative.   Genitourinary: Negative.   Musculoskeletal: Negative.  Negative for myalgias, back pain, joint swelling, arthralgias and gait problem.  Skin: Negative.  Negative for color change, pallor, rash and wound.  Allergic/Immunologic: Negative.   Neurological: Negative.  Negative for dizziness, focal weakness, weakness and light-headedness.  Hematological: Negative.  Negative for adenopathy. Does not bruise/bleed easily.  Psychiatric/Behavioral: Negative.        Objective:   Physical Exam  Vitals reviewed. Constitutional: He is oriented to person, place, and time. He appears well-developed and well-nourished.  HENT:  Head: Normocephalic and atraumatic.  Mouth/Throat: Oropharynx is clear and moist. No oropharyngeal exudate.  Eyes: Conjunctivae  are normal. Right eye exhibits no discharge. Left eye exhibits no discharge. No scleral icterus.  Neck: Normal range of motion. Neck supple. No JVD present. No tracheal deviation present. No thyromegaly present.  Cardiovascular: Normal rate, regular rhythm, normal heart sounds and intact distal pulses.  Exam reveals no gallop and no friction rub.   No murmur heard. Pulmonary/Chest: Effort normal and breath sounds normal. No stridor. No respiratory distress. He has no wheezes. He has no rales. He exhibits no tenderness.  Abdominal: Soft. Bowel sounds are normal. He exhibits no distension and no mass. There is no tenderness. There is no rebound and no guarding.  Musculoskeletal: Normal range of motion. He exhibits no edema and no tenderness.  Lymphadenopathy:    He has no cervical adenopathy.  Neurological: He is oriented to person, place, and time.  Skin: Skin is warm and dry. No rash noted. He is not diaphoretic. No erythema. No pallor.  Psychiatric: He has a normal mood and affect. His behavior is normal. Judgment and thought content normal.     Lab Results  Component Value Date   WBC 5.4 03/17/2013   HGB 13.6 03/17/2013   HCT 41.2 03/17/2013   PLT 181.0 03/17/2013   GLUCOSE 101* 01/16/2013   NA 138 01/16/2013   K 4.5 01/16/2013   CL 106 01/16/2013   CREATININE 1.2 01/16/2013   BUN 22 01/16/2013   CO2 24 01/16/2013   TSH 2.08 01/16/2013       Assessment & Plan:

## 2013-04-18 NOTE — Patient Instructions (Signed)

## 2013-05-01 ENCOUNTER — Telehealth: Payer: Self-pay | Admitting: Internal Medicine

## 2013-05-01 NOTE — Telephone Encounter (Signed)
Received 20 pages from [facility], [place]. Sent to Dr. Yetta Barre. 05/01/13/ss

## 2013-06-18 ENCOUNTER — Other Ambulatory Visit (INDEPENDENT_AMBULATORY_CARE_PROVIDER_SITE_OTHER): Payer: Medicare Other

## 2013-06-18 DIAGNOSIS — D649 Anemia, unspecified: Secondary | ICD-10-CM

## 2013-06-18 LAB — CBC WITH DIFFERENTIAL/PLATELET
Basophils Absolute: 0 10*3/uL (ref 0.0–0.1)
Eosinophils Absolute: 0.2 10*3/uL (ref 0.0–0.7)
Lymphocytes Relative: 39.7 % (ref 12.0–46.0)
MCHC: 34.2 g/dL (ref 30.0–36.0)
MCV: 97.2 fl (ref 78.0–100.0)
Monocytes Absolute: 0.5 10*3/uL (ref 0.1–1.0)
Neutrophils Relative %: 48.7 % (ref 43.0–77.0)
Platelets: 163 10*3/uL (ref 150.0–400.0)
RDW: 13.8 % (ref 11.5–14.6)

## 2013-07-22 ENCOUNTER — Ambulatory Visit (INDEPENDENT_AMBULATORY_CARE_PROVIDER_SITE_OTHER): Payer: Medicare Other | Admitting: Internal Medicine

## 2013-07-22 ENCOUNTER — Encounter: Payer: Self-pay | Admitting: Internal Medicine

## 2013-07-22 ENCOUNTER — Other Ambulatory Visit (INDEPENDENT_AMBULATORY_CARE_PROVIDER_SITE_OTHER): Payer: Medicare Other

## 2013-07-22 VITALS — BP 142/88 | HR 86 | Temp 98.3°F | Resp 16 | Wt 209.0 lb

## 2013-07-22 DIAGNOSIS — R7309 Other abnormal glucose: Secondary | ICD-10-CM

## 2013-07-22 DIAGNOSIS — E538 Deficiency of other specified B group vitamins: Secondary | ICD-10-CM | POA: Insufficient documentation

## 2013-07-22 DIAGNOSIS — G609 Hereditary and idiopathic neuropathy, unspecified: Secondary | ICD-10-CM

## 2013-07-22 DIAGNOSIS — E785 Hyperlipidemia, unspecified: Secondary | ICD-10-CM

## 2013-07-22 DIAGNOSIS — G63 Polyneuropathy in diseases classified elsewhere: Secondary | ICD-10-CM

## 2013-07-22 LAB — LIPID PANEL
Cholesterol: 230 mg/dL — ABNORMAL HIGH (ref 0–200)
Total CHOL/HDL Ratio: 8
Triglycerides: 360 mg/dL — ABNORMAL HIGH (ref 0.0–149.0)
VLDL: 72 mg/dL — ABNORMAL HIGH (ref 0.0–40.0)

## 2013-07-22 LAB — FOLATE: Folate: 19.4 ng/mL (ref >5.9)

## 2013-07-22 LAB — RPR

## 2013-07-22 LAB — COMPREHENSIVE METABOLIC PANEL
Alkaline Phosphatase: 72 U/L (ref 39–117)
BUN: 13 mg/dL (ref 6–23)
Glucose, Bld: 110 mg/dL — ABNORMAL HIGH (ref 70–99)
Sodium: 139 mEq/L (ref 135–145)
Total Bilirubin: 0.9 mg/dL (ref 0.3–1.2)

## 2013-07-22 LAB — VITAMIN B12: Vitamin B-12: 170 pg/mL — ABNORMAL LOW (ref 211–911)

## 2013-07-22 MED ORDER — GABAPENTIN 800 MG PO TABS: 800.0000 mg | ORAL_TABLET | Freq: Three times a day (TID) | ORAL | Status: DC

## 2013-07-22 MED ORDER — CYANOCOBALAMIN 500 MCG/0.1ML NA SOLN: 0.1000 mL | NASAL | Status: DC

## 2013-07-22 NOTE — Assessment & Plan Note (Signed)
He has stopped taking the statin

## 2013-07-22 NOTE — Addendum Note (Signed)
Addended by: Etta Grandchild on: 07/22/2013 08:04 PM   Modules accepted: Orders

## 2013-07-22 NOTE — Patient Instructions (Signed)
Neuropathy Neuropathy means your peripheral nerves are not working normally. Peripheral nerves are the nerves outside the brain and spinal cord. Messages between the brain and the rest of the body do not work properly with peripheral nerve disorders. CAUSES There are many different causes of peripheral nerve disorders. These include:  Injury.   Infections.   Diabetes.   Vitamin deficiency.   Poor circulation.   Alcoholism.   Exposure to toxins.   Drug effects.   Tumors.   Kidney disease.  SYMPTOMS  Tingling, burning, pain, and numbness in the extremities.   Weakness and loss of muscle tone and size.  DIAGNOSIS Blood tests and special studies of nerve function may help confirm the diagnosis.  TREATMENT  Treatment includes adopting healthy life habits.   A good diet, vitamin supplements, and mild pain medicine may be needed.   Avoid known toxins such as alcohol, tobacco, and recreational drugs.   Anti-convulsant medicines are helpful in some types of neuropathy.  Make a follow-up appointment with your caregiver to be sure you are getting better with treatment.  SEEK IMMEDIATE MEDICAL CARE IF:   You have breathing problems.   You have severe or uncontrolled pain.   You notice extreme weakness or you feel faint.   You are not better after 1 week or if you have worse symptoms.  Document Released: 12/21/2004 Document Revised: 07/26/2011 Document Reviewed: 11/13/2005 Exit[employer] Patient Information 2012 [employer]. 

## 2013-07-22 NOTE — Progress Notes (Signed)
  Subjective:    Patient ID: Nicholas Buck, male    DOB: 04/01/1941, 72 y.o.   MRN: [id number]  Back Pain This is a chronic problem. The current episode started more than 1 year ago. The problem occurs intermittently. The problem is unchanged. The pain is present in the lumbar spine. The quality of the pain is described as aching. The pain does not radiate. The pain is at a severity of 2/10. The pain is mild. Associated symptoms include numbness (he has had numbness in both feet s/p low back surgery). Pertinent negatives include no abdominal pain, bladder incontinence, bowel incontinence, chest pain, dysuria, fever, headaches, leg pain, paresis, paresthesias, pelvic pain, perianal numbness, tingling, weakness or weight loss. He has tried NSAIDs for the symptoms. The treatment provided significant relief.      Review of Systems  Constitutional: Negative.  Negative for fever, chills, weight loss, diaphoresis, activity change, appetite change, fatigue and unexpected weight change.  HENT: Negative.   Eyes: Negative.   Respiratory: Negative.  Negative for cough, chest tightness, shortness of breath, wheezing and stridor.   Cardiovascular: Negative.  Negative for chest pain, palpitations and leg swelling.  Gastrointestinal: Negative.  Negative for nausea, vomiting, abdominal pain, diarrhea, constipation and bowel incontinence.  Endocrine: Negative.   Genitourinary: Negative.  Negative for bladder incontinence, dysuria and pelvic pain.  Musculoskeletal: Positive for back pain. Negative for myalgias, joint swelling, arthralgias and gait problem.  Skin: Negative.   Allergic/Immunologic: Negative.   Neurological: Positive for numbness (he has had numbness in both feet s/p low back surgery). Negative for tingling, weakness, headaches and paresthesias.  Hematological: Negative for adenopathy. Does not bruise/bleed easily.  Psychiatric/Behavioral: Negative.        Objective:   Physical Exam  Vitals  reviewed. Constitutional: He is oriented to person, place, and time. He appears well-developed and well-nourished. No distress.  HENT:  Head: Normocephalic and atraumatic.  Mouth/Throat: Oropharynx is clear and moist. No oropharyngeal exudate.  Eyes: Conjunctivae are normal. Right eye exhibits no discharge. Left eye exhibits no discharge. No scleral icterus.  Neck: Normal range of motion. Neck supple. No JVD present. No tracheal deviation present. No thyromegaly present.  Cardiovascular: Normal rate, regular rhythm, normal heart sounds and intact distal pulses.  Exam reveals no gallop and no friction rub.   No murmur heard. Pulmonary/Chest: Effort normal and breath sounds normal. No stridor. No respiratory distress. He has no wheezes. He has no rales. He exhibits no tenderness.  Abdominal: Soft. Bowel sounds are normal. He exhibits no distension and no mass. There is no tenderness. There is no rebound and no guarding.  Musculoskeletal: Normal range of motion. He exhibits no edema and no tenderness.  Lymphadenopathy:    He has no cervical adenopathy.  Neurological: He is oriented to person, place, and time.  Skin: Skin is warm and dry. No rash noted. He is not diaphoretic. No erythema. No pallor.      Lab Results  Component Value Date   WBC 6.4 06/18/2013   HGB 14.8 06/18/2013   HCT 43.3 06/18/2013   PLT 163.0 06/18/2013   GLUCOSE 101* 01/16/2013   NA 138 01/16/2013   K 4.5 01/16/2013   CL 106 01/16/2013   CREATININE 1.2 01/16/2013   BUN 22 01/16/2013   CO2 24 01/16/2013   TSH 2.08 01/16/2013      Assessment & Plan:

## 2013-07-22 NOTE — Assessment & Plan Note (Signed)
I will check his A1C to see if he has DM2 

## 2013-07-22 NOTE — Assessment & Plan Note (Signed)
I will check his labs today to look for secondary causes He tells me that this has been a problem since his low back surgery I do not see any clinical change today I will increase the neurontin dose at his request

## 2013-07-24 ENCOUNTER — Encounter: Payer: Self-pay | Admitting: Internal Medicine

## 2013-07-24 LAB — PROTEIN ELECTROPHORESIS, SERUM
Albumin ELP: 61.9 % (ref 55.8–66.1)
Alpha-1-Globulin: 7 % — ABNORMAL HIGH (ref 2.9–4.9)
Total Protein, Serum Electrophoresis: 7.3 g/dL (ref 6.0–8.3)

## 2013-07-25 ENCOUNTER — Telehealth: Payer: Self-pay | Admitting: *Deleted

## 2013-07-25 DIAGNOSIS — E785 Hyperlipidemia, unspecified: Secondary | ICD-10-CM

## 2013-07-25 MED ORDER — ATORVASTATIN CALCIUM 40 MG PO TABS: 40.0000 mg | ORAL_TABLET | Freq: Every day | ORAL | Status: DC

## 2013-07-25 NOTE — Assessment & Plan Note (Signed)
He will start atrovastatin

## 2013-07-25 NOTE — Telephone Encounter (Signed)
Pt called states he was to believe that he was to start a cholesterol medication.  Please advise

## 2013-07-29 ENCOUNTER — Ambulatory Visit (INDEPENDENT_AMBULATORY_CARE_PROVIDER_SITE_OTHER): Payer: Medicare Other

## 2013-07-29 DIAGNOSIS — E538 Deficiency of other specified B group vitamins: Secondary | ICD-10-CM

## 2013-07-29 DIAGNOSIS — Z23 Encounter for immunization: Secondary | ICD-10-CM

## 2013-07-29 DIAGNOSIS — G63 Polyneuropathy in diseases classified elsewhere: Secondary | ICD-10-CM

## 2013-07-29 MED ORDER — CYANOCOBALAMIN 1000 MCG/ML IJ SOLN
1000.0000 ug | Freq: Once | INTRAMUSCULAR | Status: AC
  Administered 2013-07-29: 1000 ug via INTRAMUSCULAR

## 2013-07-29 MED ORDER — CYANOCOBALAMIN 2000 MCG PO TABS: 2000.0000 ug | ORAL_TABLET | Freq: Every day | ORAL | Status: DC

## 2013-07-29 NOTE — Addendum Note (Signed)
Addended by: Etta Grandchild on: 07/29/2013 07:34 AM   Modules accepted: Orders, Medications

## 2013-08-20 ENCOUNTER — Encounter: Payer: Self-pay | Admitting: Internal Medicine

## 2013-08-20 ENCOUNTER — Ambulatory Visit (INDEPENDENT_AMBULATORY_CARE_PROVIDER_SITE_OTHER): Payer: Medicare Other | Admitting: Internal Medicine

## 2013-08-20 VITALS — BP 128/80 | HR 77 | Temp 98.1°F | Resp 16 | Wt 210.0 lb

## 2013-08-20 DIAGNOSIS — G63 Polyneuropathy in diseases classified elsewhere: Secondary | ICD-10-CM

## 2013-08-20 DIAGNOSIS — E538 Deficiency of other specified B group vitamins: Secondary | ICD-10-CM

## 2013-08-20 DIAGNOSIS — E785 Hyperlipidemia, unspecified: Secondary | ICD-10-CM

## 2013-08-20 MED ORDER — CYANOCOBALAMIN 1000 MCG/ML IJ SOLN
1000.0000 ug | Freq: Once | INTRAMUSCULAR | Status: AC
  Administered 2013-08-20: 1000 ug via INTRAMUSCULAR

## 2013-08-20 NOTE — Progress Notes (Signed)
  Subjective:    Patient ID: Nicholas Buck, male    DOB: 17-Aug-1941, 72 y.o.   MRN: [id number]  Hyperlipidemia This is a chronic problem. The current episode started more than 1 year ago. The problem is controlled. Recent lipid tests were reviewed and are variable. He has no history of chronic renal disease, diabetes, hypothyroidism, liver disease or nephrotic syndrome. There are no known factors aggravating his hyperlipidemia. Pertinent negatives include no chest pain, focal sensory loss, focal weakness, leg pain, myalgias or shortness of breath. Current antihyperlipidemic treatment includes statins. The current treatment provides moderate improvement of lipids. Compliance problems include adherence to exercise and adherence to diet.       Review of Systems  Constitutional: Negative.  Negative for fever, chills, diaphoresis, appetite change and fatigue.  HENT: Negative.   Eyes: Negative.   Respiratory: Negative.  Negative for cough, chest tightness, shortness of breath and stridor.   Cardiovascular: Negative.  Negative for chest pain, palpitations and leg swelling.  Gastrointestinal: Negative.  Negative for nausea, vomiting, abdominal pain, diarrhea and constipation.  Endocrine: Negative.   Genitourinary: Negative.   Musculoskeletal: Positive for arthralgias. Negative for myalgias, back pain, joint swelling and gait problem.  Skin: Negative.   Allergic/Immunologic: Negative.   Neurological: Positive for numbness (in both feet). Negative for dizziness, tremors, focal weakness, syncope and weakness.  Hematological: Negative.  Negative for adenopathy. Does not bruise/bleed easily.  Psychiatric/Behavioral: Negative.        Objective:   Physical Exam  Vitals reviewed. Constitutional: He is oriented to person, place, and time. He appears well-developed and well-nourished. No distress.  HENT:  Head: Normocephalic and atraumatic.  Mouth/Throat: Oropharynx is clear and moist. No oropharyngeal  exudate.  Eyes: Conjunctivae are normal. Right eye exhibits no discharge. Left eye exhibits no discharge. No scleral icterus.  Neck: Normal range of motion. Neck supple. No JVD present. No tracheal deviation present. No thyromegaly present.  Cardiovascular: Normal rate, regular rhythm, normal heart sounds and intact distal pulses.  Exam reveals no gallop and no friction rub.   No murmur heard. Pulmonary/Chest: Effort normal and breath sounds normal. No stridor. No respiratory distress. He has no wheezes. He has no rales. He exhibits no tenderness.  Abdominal: Soft. Bowel sounds are normal. He exhibits no distension and no mass. There is no tenderness. There is no rebound and no guarding.  Musculoskeletal: Normal range of motion. He exhibits no edema and no tenderness.  Lymphadenopathy:    He has no cervical adenopathy.  Neurological: He is oriented to person, place, and time.  Skin: Skin is warm and dry. No rash noted. He is not diaphoretic. No erythema. No pallor.  Psychiatric: He has a normal mood and affect. His behavior is normal. Judgment and thought content normal.     Lab Results  Component Value Date   WBC 6.4 06/18/2013   HGB 14.8 06/18/2013   HCT 43.3 06/18/2013   PLT 163.0 06/18/2013   GLUCOSE 110* 07/22/2013   CHOL 230* 07/22/2013   TRIG 360.0* 07/22/2013   HDL 29.60* 07/22/2013   LDLDIRECT 141.5 07/22/2013   ALT 24 07/22/2013   AST 25 07/22/2013   NA 139 07/22/2013   K 4.3 07/22/2013   CL 104 07/22/2013   CREATININE 1.0 07/22/2013   BUN 13 07/22/2013   CO2 27 07/22/2013   TSH 1.92 07/22/2013   HGBA1C 5.4 07/22/2013       Assessment & Plan:

## 2013-08-20 NOTE — Assessment & Plan Note (Signed)
He is doing well on lipitor 

## 2013-08-20 NOTE — Assessment & Plan Note (Signed)
Will continue B12 injections, first every 2 weeks then monthly I told him that it may take up to a year for the nerve damage to recover

## 2013-08-20 NOTE — Patient Instructions (Signed)

## 2013-09-03 ENCOUNTER — Telehealth: Payer: Self-pay | Admitting: *Deleted

## 2013-09-03 DIAGNOSIS — E785 Hyperlipidemia, unspecified: Secondary | ICD-10-CM

## 2013-09-03 MED ORDER — ATORVASTATIN CALCIUM 40 MG PO TABS: 20.0000 mg | ORAL_TABLET | Freq: Every day | ORAL | Status: DC

## 2013-09-03 NOTE — Telephone Encounter (Signed)
Pt called states Atorvastatin is causing diarrhea.  States he has stopped the medication.  Please advise of alternative medication in Dr Yetta Barre absence.

## 2013-09-03 NOTE — Telephone Encounter (Signed)
Spoke with pt advised of MDs message 

## 2013-09-03 NOTE — Telephone Encounter (Signed)
Try taking half dose, 20 mg of atorvastatin daily

## 2013-09-22 ENCOUNTER — Ambulatory Visit: Payer: Medicare Other

## 2013-09-30 ENCOUNTER — Ambulatory Visit (INDEPENDENT_AMBULATORY_CARE_PROVIDER_SITE_OTHER): Payer: Medicare Other

## 2013-09-30 DIAGNOSIS — E538 Deficiency of other specified B group vitamins: Secondary | ICD-10-CM

## 2013-09-30 MED ORDER — CYANOCOBALAMIN 1000 MCG/ML IJ SOLN
1000.0000 ug | Freq: Once | INTRAMUSCULAR | Status: AC
  Administered 2013-09-30: 1000 ug via INTRAMUSCULAR

## 2013-10-27 ENCOUNTER — Ambulatory Visit (INDEPENDENT_AMBULATORY_CARE_PROVIDER_SITE_OTHER): Payer: Medicare Other

## 2013-10-27 ENCOUNTER — Telehealth: Payer: Self-pay

## 2013-10-27 ENCOUNTER — Ambulatory Visit: Payer: Medicare Other | Admitting: Internal Medicine

## 2013-10-27 DIAGNOSIS — E538 Deficiency of other specified B group vitamins: Secondary | ICD-10-CM

## 2013-10-27 MED ORDER — CYANOCOBALAMIN 1000 MCG/ML IJ SOLN
1000.0000 ug | Freq: Once | INTRAMUSCULAR | Status: AC
  Administered 2013-10-27: 1000 ug via INTRAMUSCULAR

## 2013-10-27 NOTE — Telephone Encounter (Signed)
Pt came in for nurse visit this morning and while here c/o left side neck and shoulder pain. He describes the pain as burning and states that he does not recall doing anything other than maybe sleeping wrong. He is scheduled to go out of town and would like to know if MD thinks that he could benefit from a muscle relaxer. Thanks

## 2013-10-27 NOTE — Telephone Encounter (Signed)
Try advil first

## 2013-10-28 MED ORDER — CYCLOBENZAPRINE HCL 10 MG PO TABS: 10.0000 mg | ORAL_TABLET | Freq: Three times a day (TID) | ORAL | Status: DC | PRN

## 2013-10-28 NOTE — Telephone Encounter (Signed)
Per pt, OTC ibuprofen has not helped.

## 2013-10-28 NOTE — Telephone Encounter (Signed)
LMOVM advising pt of Rx

## 2013-10-28 NOTE — Telephone Encounter (Signed)
Try flexeril 

## 2013-10-31 ENCOUNTER — Ambulatory Visit: Payer: Medicare Other

## 2013-11-06 ENCOUNTER — Other Ambulatory Visit: Payer: Self-pay | Admitting: Internal Medicine

## 2013-11-24 ENCOUNTER — Ambulatory Visit: Payer: Medicare Other | Admitting: Internal Medicine

## 2013-12-05 ENCOUNTER — Ambulatory Visit (INDEPENDENT_AMBULATORY_CARE_PROVIDER_SITE_OTHER): Payer: Medicare Other

## 2013-12-05 DIAGNOSIS — E538 Deficiency of other specified B group vitamins: Secondary | ICD-10-CM

## 2013-12-05 DIAGNOSIS — G63 Polyneuropathy in diseases classified elsewhere: Secondary | ICD-10-CM

## 2013-12-05 MED ORDER — CYANOCOBALAMIN 1000 MCG/ML IJ SOLN
1000.0000 ug | Freq: Once | INTRAMUSCULAR | Status: AC
  Administered 2013-12-05: 1000 ug via INTRAMUSCULAR

## 2013-12-23 ENCOUNTER — Other Ambulatory Visit: Payer: Self-pay | Admitting: Internal Medicine

## 2013-12-23 NOTE — Telephone Encounter (Signed)
Pt wants the generic if possible for the flexeril.

## 2014-01-02 ENCOUNTER — Ambulatory Visit (INDEPENDENT_AMBULATORY_CARE_PROVIDER_SITE_OTHER)
Admission: RE | Admit: 2014-01-02 | Discharge: 2014-01-02 | Disposition: A | Discharge status: Home / Self Care | Payer: Medicare Other | Source: Ambulatory Visit | Attending: Family Medicine | Admitting: Family Medicine

## 2014-01-02 ENCOUNTER — Encounter: Payer: Self-pay | Admitting: Family Medicine

## 2014-01-02 ENCOUNTER — Encounter: Payer: Self-pay | Admitting: Internal Medicine

## 2014-01-02 ENCOUNTER — Ambulatory Visit (INDEPENDENT_AMBULATORY_CARE_PROVIDER_SITE_OTHER): Payer: Medicare Other | Admitting: Internal Medicine

## 2014-01-02 ENCOUNTER — Ambulatory Visit (INDEPENDENT_AMBULATORY_CARE_PROVIDER_SITE_OTHER): Payer: Medicare Other | Admitting: Family Medicine

## 2014-01-02 VITALS — BP 128/88 | HR 80 | Temp 97.9°F | Resp 16 | Wt 210.0 lb

## 2014-01-02 DIAGNOSIS — M19019 Primary osteoarthritis, unspecified shoulder: Secondary | ICD-10-CM | POA: Insufficient documentation

## 2014-01-02 DIAGNOSIS — E538 Deficiency of other specified B group vitamins: Secondary | ICD-10-CM

## 2014-01-02 DIAGNOSIS — M25519 Pain in unspecified shoulder: Secondary | ICD-10-CM

## 2014-01-02 DIAGNOSIS — M542 Cervicalgia: Secondary | ICD-10-CM

## 2014-01-02 DIAGNOSIS — G63 Polyneuropathy in diseases classified elsewhere: Secondary | ICD-10-CM

## 2014-01-02 MED ORDER — PREDNISONE 50 MG PO TABS: 50.0000 mg | ORAL_TABLET | Freq: Every day | ORAL | Status: DC

## 2014-01-02 MED ORDER — CYANOCOBALAMIN 1000 MCG/ML IJ SOLN
1000.0000 ug | Freq: Once | INTRAMUSCULAR | Status: AC
  Administered 2014-01-02: 1000 ug via INTRAMUSCULAR

## 2014-01-02 NOTE — Assessment & Plan Note (Signed)
After verbal consent patient was prepped with alcohol swabs N/A 27-gauge 1-1/2 inch needle was injected under ultrasound guidance. Patient had 2 cc of 0.5% Marcaine and 1 cc of Kenalog 40 mg/dL put into the a.c. joint. Patient tolerated the procedure well with a mild decrease in pain immediately. Post injection instruction given. Patient also given him another home exercise. Discussed icing protocol and over-the-counter medicines. We'll discuss again in 4 weeks.

## 2014-01-02 NOTE — Assessment & Plan Note (Addendum)
Patient is a past medical history significant for previous cervical neck surgery multiple years ago. 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 Patient does have a positive Spurling's which makes me concerned for nerve root impingement. We will get x-rays today of the neck as well as the shoulder. Patient given home exercise program, discussed changing his gabapentin 2 more of a Lyrica. Patient was given a trial size for the next 3 weeks. Discuss over-the-counter medicines they can be beneficial. Discussed icing protocol. Patient come back in 3-4 weeks for further evaluation. If this continues I would consider further workup including a CT scan if there is found to be hardware in his neck on x-ray. We'll also treat patient with prednisone for a short course. Warned of potential side effects.

## 2014-01-02 NOTE — Progress Notes (Signed)
[facility] Nicholas Buck, Nicholas Buck Phone: [phone] Subjective:    I'm seeing this patient by the request  of:  Scarlette Calico, MD   CC: Left shoulder pain  [id number] Nicholas Buck is a 73 y.o. male coming in with complaint of left shoulder pain. Patient states he has had this pain for quite some time possibly months to years but seems to be worsening over the course last several weeks. Patient states that it seems to hurt with almost any type of movement especially his golf swing. Patient may have noticed it first when He was attempting to move heavy boxes. Patient does not have any significant past medical history of shoulder problems before. Patient also go does have a past medical history of an anterior fusion of his neck multiple years ago. Patient describes the pain as more of a sharp burning sensation with certain movements. Patient does not know if he has neck pain is associated with it. Patient states that he is having some mild weakness as well. Severity is 9/10.     Past medical history, social, surgical and family history all reviewed in electronic medical record.   Review of Systems: No headache, visual changes, nausea, vomiting, diarrhea, constipation, dizziness, abdominal pain, skin rash, fevers, chills, night sweats, weight loss, swollen lymph nodes, body aches, joint swelling, muscle aches, chest pain, shortness of breath, mood changes.   Objective Vitals reviewed.   General: No apparent distress alert and oriented x3 mood and affect normal, dressed appropriately.  HEENT: Pupils equal, extraocular movements intact  Respiratory: Patient's speak in full sentences and does not appear short of breath  Cardiovascular: No lower extremity edema, non tender, no erythema  Skin: Warm dry intact with no signs of infection or rash on extremities or on axial skeleton.  Abdomen: Soft nontender  Neuro: Cranial nerves II through XII are  intact, neurovascularly intact in all extremities with 2+ DTRs and 2+ pulses.  Lymph: No lymphadenopathy of posterior or anterior cervical chain or axillae bilaterally.  Gait normal with good balance and coordination.  MSK: Non tender with full range of motion and good stability and symmetric strength and tone of elbows, wrist, knee and ankles bilaterally.  Shoulder:left Inspection reveals no abnormalities, or asymmetry. Patient does have atrophy of the shoulder musculature compared to the contralateral side. Palpation is normal but with tenderness over AC joint but none on bicipital groove. ROM is full in all planes. Rotator cuff strength 4/5 compared to contralateral side No signs of impingement with negative Neer and Hawkin's tests, positive empty can empty can sign. Speeds and Yergason's tests normal. No labral pathology noted with negative Obrien's, negative clunk and good stability. Positive crossover no. Normal scapular function observed. No painful arc and no drop arm sign. No apprehension sign   Neck: Inspection unremarkable. No palpable stepoffs. Severe pain with Spurling's maneuver. Full neck range of motion Grip strength and sensation normal in bilateral hands Strength shows weakness of the C4 nerve root distribution No sensory change to C4 to T1 Negative Hoffman sign bilaterally Reflexes normal  MSK US performed of: Left shoulder This study was ordered, performed, and interpreted by Nicholas Buck D.O.  Shoulder:   Supraspinatus:  Appears normal on long and transverse views, no bursal bulge seen with shoulder abduction on impingement view. Mild degenerative changes Infraspinatus:  Appears normal on long and transverse views with some degenerative changes Subscapularis:  Appears normal on long and transverse views. Teres Minor:  Appears normal on long and transverse views. 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 Glenohumeral Joint: Moderate  arthritis Glenoid Labrum:  Intact without visualized tears. Biceps Tendon:  Appears normal on long and transverse views, no fraying of tendon, tendon located in intertubercular groove, no subluxation with shoulder internal or external rotation. No increased power doppler signal.  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    Impression and Recommendations:     This case required medical decision making of moderate complexity.

## 2014-01-02 NOTE — Progress Notes (Signed)
Pre visit review using our clinic review tool, if applicable. No additional management support is needed unless otherwise documented below in the visit note. 

## 2014-01-02 NOTE — Patient Instructions (Addendum)
Good to meet you I think some of this is coming from your your neck We will get xrays to look at the neck.  Try prednisone daily for 5 days.  Ask Ronnald Ramp if switch to lyrica from gabapentin.  Try exercises most days of the week Come back and see me again in 1-2 weeks.

## 2014-01-02 NOTE — Progress Notes (Signed)
   Subjective:    Patient ID: Nicholas Buck, male    DOB: 10-16-1941, 73 y.o.   MRN: [id number]  Anemia Presents for follow-up visit. There has been no abdominal pain, anorexia, bruising/bleeding easily, confusion, fever, leg swelling, light-headedness, malaise/fatigue, palpitations, paresthesias or pica. Signs of blood loss that are not present include hematemesis, hematochezia and melena. Past treatments include parenteral vitamin B12. There are no compliance problems.       Review of Systems  Constitutional: Negative.  Negative for fever, chills, malaise/fatigue, diaphoresis, appetite change and fatigue.  HENT: Negative.  Negative for trouble swallowing.   Eyes: Negative.   Respiratory: Negative.  Negative for cough, choking, chest tightness, wheezing and stridor.   Cardiovascular: Negative.  Negative for palpitations and leg swelling.  Gastrointestinal: Negative.  Negative for nausea, vomiting, abdominal pain, diarrhea, constipation, melena, hematochezia, anorexia and hematemesis.  Endocrine: Negative.   Genitourinary: Negative.   Musculoskeletal: Positive for arthralgias (left shoulder). Negative for back pain, gait problem, joint swelling, myalgias and neck stiffness.  Skin: Negative.   Allergic/Immunologic: Negative.   Neurological: Negative.  Negative for dizziness, tremors, facial asymmetry, weakness, light-headedness, numbness and paresthesias.  Hematological: Negative.  Negative for adenopathy. Does not bruise/bleed easily.  Psychiatric/Behavioral: Negative.  Negative for confusion.       Objective:   Physical Exam  Vitals reviewed. Constitutional: He is oriented to person, place, and time. He appears well-developed and well-nourished. No distress.  HENT:  Head: Normocephalic and atraumatic.  Mouth/Throat: Oropharynx is clear and moist. No oropharyngeal exudate.  Eyes: Conjunctivae are normal. Right eye exhibits no discharge. Left eye exhibits no discharge. No scleral  icterus.  Neck: Normal range of motion. Neck supple. No JVD present. No tracheal deviation present. No thyromegaly present.  Cardiovascular: Normal rate, regular rhythm, normal heart sounds and intact distal pulses.  Exam reveals no gallop and no friction rub.   No murmur heard. Pulmonary/Chest: Effort normal and breath sounds normal. No stridor. No respiratory distress. He has no wheezes. He has no rales. He exhibits no tenderness.  Abdominal: Soft. Bowel sounds are normal. He exhibits no distension and no mass. There is no tenderness. There is no rebound and no guarding.  Musculoskeletal: He exhibits no edema.       Left shoulder: He exhibits decreased range of motion, tenderness and bony tenderness. He exhibits no swelling, no effusion and no crepitus.  Lymphadenopathy:    He has no cervical adenopathy.  Neurological: He is oriented to person, place, and time.  Skin: Skin is warm and dry. No rash noted. He is not diaphoretic. No erythema. No pallor.     Lab Results  Component Value Date   WBC 6.4 06/18/2013   HGB 14.8 06/18/2013   HCT 43.3 06/18/2013   PLT 163.0 06/18/2013   GLUCOSE 110* 07/22/2013   CHOL 230* 07/22/2013   TRIG 360.0* 07/22/2013   HDL 29.60* 07/22/2013   LDLDIRECT 141.5 07/22/2013   ALT 24 07/22/2013   AST 25 07/22/2013   NA 139 07/22/2013   K 4.3 07/22/2013   CL 104 07/22/2013   CREATININE 1.0 07/22/2013   BUN 13 07/22/2013   CO2 27 07/22/2013   TSH 1.92 07/22/2013   HGBA1C 5.4 07/22/2013       Assessment & Plan:

## 2014-01-05 ENCOUNTER — Ambulatory Visit: Payer: Medicare Other

## 2014-01-05 NOTE — Assessment & Plan Note (Signed)
B12 injection today 

## 2014-01-13 ENCOUNTER — Ambulatory Visit: Payer: Medicare Other | Admitting: Internal Medicine

## 2014-01-13 ENCOUNTER — Ambulatory Visit: Payer: Medicare Other | Admitting: Family Medicine

## 2014-01-14 ENCOUNTER — Other Ambulatory Visit (INDEPENDENT_AMBULATORY_CARE_PROVIDER_SITE_OTHER): Payer: Medicare Other

## 2014-01-14 ENCOUNTER — Encounter: Payer: Self-pay | Admitting: Internal Medicine

## 2014-01-14 ENCOUNTER — Ambulatory Visit (INDEPENDENT_AMBULATORY_CARE_PROVIDER_SITE_OTHER): Payer: Medicare Other | Admitting: Internal Medicine

## 2014-01-14 ENCOUNTER — Ambulatory Visit (INDEPENDENT_AMBULATORY_CARE_PROVIDER_SITE_OTHER): Payer: Medicare Other | Admitting: Family Medicine

## 2014-01-14 ENCOUNTER — Encounter: Payer: Self-pay | Admitting: Family Medicine

## 2014-01-14 ENCOUNTER — Telehealth: Payer: Self-pay | Admitting: *Deleted

## 2014-01-14 ENCOUNTER — Ambulatory Visit (INDEPENDENT_AMBULATORY_CARE_PROVIDER_SITE_OTHER)
Admission: RE | Admit: 2014-01-14 | Discharge: 2014-01-14 | Disposition: A | Discharge status: Home / Self Care | Payer: Medicare Other | Source: Ambulatory Visit | Attending: Family Medicine | Admitting: Family Medicine

## 2014-01-14 ENCOUNTER — Encounter: Payer: Self-pay | Admitting: *Deleted

## 2014-01-14 VITALS — BP 132/70 | HR 95 | Temp 97.6°F | Resp 16 | Wt 199.1 lb

## 2014-01-14 VITALS — BP 132/70 | HR 95 | Temp 97.6°F | Resp 16 | Ht 73.0 in | Wt 199.0 lb

## 2014-01-14 DIAGNOSIS — M542 Cervicalgia: Secondary | ICD-10-CM

## 2014-01-14 DIAGNOSIS — Z23 Encounter for immunization: Secondary | ICD-10-CM

## 2014-01-14 DIAGNOSIS — E785 Hyperlipidemia, unspecified: Secondary | ICD-10-CM

## 2014-01-14 LAB — COMPREHENSIVE METABOLIC PANEL
ALT: 24 U/L (ref 0–53)
AST: 15 U/L (ref 0–37)
Albumin: 4.2 g/dL (ref 3.5–5.2)
Alkaline Phosphatase: 89 U/L (ref 39–117)
BILIRUBIN TOTAL: 0.9 mg/dL (ref 0.3–1.2)
BUN: 17 mg/dL (ref 6–23)
CO2: 28 mEq/L (ref 19–32)
CREATININE: 1 mg/dL (ref 0.4–1.5)
Calcium: 9.4 mg/dL (ref 8.4–10.5)
Chloride: 103 mEq/L (ref 96–112)
GFR: 75.2 mL/min (ref >60.00)
Glucose, Bld: 99 mg/dL (ref 70–99)
Potassium: 4.7 mEq/L (ref 3.5–5.1)
SODIUM: 137 meq/L (ref 135–145)
TOTAL PROTEIN: 7.7 g/dL (ref 6.0–8.3)

## 2014-01-14 LAB — LDL CHOLESTEROL, DIRECT: Direct LDL: 135.3 mg/dL

## 2014-01-14 LAB — LIPID PANEL
Cholesterol: 197 mg/dL (ref 0–200)
HDL: 33 mg/dL — ABNORMAL LOW (ref >39.00)
TRIGLYCERIDES: 207 mg/dL — AB (ref 0.0–149.0)
Total CHOL/HDL Ratio: 6
VLDL: 41.4 mg/dL — AB (ref 0.0–40.0)

## 2014-01-14 MED ORDER — PREGABALIN 50 MG PO CAPS: 50.0000 mg | ORAL_CAPSULE | Freq: Three times a day (TID) | ORAL | Status: DC

## 2014-01-14 MED ORDER — MELOXICAM 15 MG PO TABS: 15.0000 mg | ORAL_TABLET | Freq: Every day | ORAL | Status: DC

## 2014-01-14 MED ORDER — HYDROXYZINE HCL 25 MG PO TABS: 25.0000 mg | ORAL_TABLET | Freq: Three times a day (TID) | ORAL | Status: DC | PRN

## 2014-01-14 NOTE — Progress Notes (Signed)
  [facility] [address], [place] [address] Phone: [phone] Subjective:      CC: Left shoulder pain, neck pain follow up.  [id number] Nicholas Buck is a 73 y.o. male coming in with complaint of left shoulder pain. Patient was found to have a positive Spurling's test was sent for x-rays. 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 Patient did respond well to the prednisone as well as the Lyrica instead of his gabapentin. Patient states that he feels approximately 90% better but still does not feel good enough to golf which is his main goal. patient would like to avoid any other type of surgeries. Denies any new symptoms. Once again patient does have a past medical history significant for an anterior fusion of C6-7.    Past medical history, social, surgical and family history all reviewed in electronic medical record.   Review of Systems: No headache, visual changes, nausea, vomiting, diarrhea, constipation, dizziness, abdominal pain, skin rash, fevers, chills, night sweats, weight loss, swollen lymph nodes, body aches, joint swelling, muscle aches, chest pain, shortness of breath, mood changes.   Objective Vitals reviewed.  Blood pressure 132/70, pulse 95, temperature 97.6 F (36.4 C), temperature source Oral, resp. rate 16, weight 199 lb 1.3 oz (90.302 kg), SpO2 99.00%.   General: No apparent distress alert and oriented x3 mood and affect normal, dressed appropriately.  HEENT: Pupils equal, extraocular movements intact  Respiratory: Patient's speak in full sentences and does not appear short of breath  Cardiovascular: No lower extremity edema, non tender, no erythema  Skin: Warm dry intact with no signs of infection or rash on extremities or on axial skeleton.  Abdomen: Soft nontender  Neuro: Cranial nerves II through XII are intact, neurovascularly intact in all extremities with 2+ DTRs and 2+ pulses.    Lymph: No lymphadenopathy of posterior or anterior cervical chain or axillae bilaterally.  Gait normal with good balance and coordination.  MSK: Non tender with full range of motion and good stability and symmetric strength and tone of elbows, wrist, knee and ankles bilaterally.    Neck: Inspection unremarkable. No palpable stepoffs. Severe pain with Spurling's maneuver. Good range of motion with side bending to the left as well as rotation to the left. Grip strength and sensation normal in bilateral hands Strength shows weakness of the C4 nerve root distribution No sensory change to C4 to T1 Negative Hoffman sign bilaterally Reflexes normal  X-rays were reviewed by me today. Patient does have 1. 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 Multilevel bilateral prominent neural foraminal narrowing is present.  2. Fusion of C6 - C7.     Impression and Recommendations:     This case required medical decision making of moderate complexity.

## 2014-01-14 NOTE — Assessment & Plan Note (Signed)
Some improvement noted He will stay on the statin and will work on his lifestyle modifications

## 2014-01-14 NOTE — Patient Instructions (Addendum)
Good to see you.  Try exercises most days of the week.  Heat before activity and ice after activity.  Start lyrica 50 mg three times daily.  Meloxicam daily  Hydroxyzine for anxiety up to 3 times daily.  Start vitamin D 2000 IU daily.  Come back in 3 weeks.

## 2014-01-14 NOTE — Telephone Encounter (Signed)
PA for Hydroxyzine HCL 25 mg tabs approved by Delcie Roch at [facility] thru 01/14/2015.  Reference #  [id number]

## 2014-01-14 NOTE — Assessment & Plan Note (Signed)
Spent greater than 25 minutes with patient face-to-face and had greater than 50% of counseling including as described above in assessment and plan. We discussed continuing the Lyrica because he is having some response. Discussed over-the-counter medicines including vitamin D that could be beneficial. Home exercise program given for range of motion exercises to start most days a week. We discussed formal physical therapy which patient declined. Patient and will come back again in 3 weeks or so. Please see patient instructions for further detail.

## 2014-01-14 NOTE — Progress Notes (Signed)
Pre-visit discussion using our clinic review tool. No additional management support is needed unless otherwise documented below in the visit note.  

## 2014-01-14 NOTE — Progress Notes (Signed)
   Subjective:    Patient ID: Nicholas Buck, male    DOB: 10/09/1941, 73 y.o.   MRN: [id number]  Hyperlipidemia This is a chronic problem. The current episode started more than 1 year ago. The problem is controlled. Recent lipid tests were reviewed and are variable. Exacerbating diseases include obesity. He has no history of chronic renal disease, diabetes, hypothyroidism, liver disease or nephrotic syndrome. Factors aggravating his hyperlipidemia include fatty foods. Pertinent negatives include no chest pain, focal sensory loss, focal weakness, leg pain, myalgias or shortness of breath. Current antihyperlipidemic treatment includes statins, exercise and diet change. The current treatment provides moderate improvement of lipids. There are no compliance problems.       Review of Systems  Constitutional: Negative.  Negative for fever, chills, diaphoresis, appetite change and fatigue.  HENT: Negative.   Eyes: Negative.   Respiratory: Negative.  Negative for cough, choking, chest tightness, shortness of breath, wheezing and stridor.   Cardiovascular: Negative.  Negative for chest pain, palpitations and leg swelling.  Gastrointestinal: Negative.  Negative for nausea, vomiting, abdominal pain, diarrhea, constipation and blood in stool.  Endocrine: Negative.   Genitourinary: Negative.   Musculoskeletal: Negative.  Negative for myalgias.  Skin: Negative.   Allergic/Immunologic: Negative.   Neurological: Negative.  Negative for dizziness and focal weakness.  Hematological: Negative.  Negative for adenopathy. Does not bruise/bleed easily.  Psychiatric/Behavioral: Negative.        Objective:   Physical Exam  Vitals reviewed. Constitutional: He is oriented to person, place, and time. He appears well-developed and well-nourished. No distress.  HENT:  Head: Normocephalic and atraumatic.  Mouth/Throat: Oropharynx is clear and moist. No oropharyngeal exudate.  Eyes: Conjunctivae are normal. Right  eye exhibits no discharge. Left eye exhibits no discharge. No scleral icterus.  Neck: Normal range of motion. Neck supple. No JVD present. No tracheal deviation present. No thyromegaly present.  Cardiovascular: Normal rate, regular rhythm, normal heart sounds and intact distal pulses.  Exam reveals no gallop and no friction rub.   No murmur heard. Pulmonary/Chest: Effort normal and breath sounds normal. No stridor. No respiratory distress. He has no wheezes. He has no rales. He exhibits no tenderness.  Abdominal: Soft. Bowel sounds are normal. He exhibits no distension and no mass. There is no tenderness. There is no rebound and no guarding.  Musculoskeletal: Normal range of motion. He exhibits no edema and no tenderness.  Lymphadenopathy:    He has no cervical adenopathy.  Neurological: He is oriented to person, place, and time.  Skin: Skin is warm and dry. No rash noted. He is not diaphoretic. No erythema. No pallor.     Lab Results  Component Value Date   WBC 6.4 06/18/2013   HGB 14.8 06/18/2013   HCT 43.3 06/18/2013   PLT 163.0 06/18/2013   GLUCOSE 110* 07/22/2013   CHOL 230* 07/22/2013   TRIG 360.0* 07/22/2013   HDL 29.60* 07/22/2013   LDLDIRECT 141.5 07/22/2013   ALT 24 07/22/2013   AST 25 07/22/2013   NA 139 07/22/2013   K 4.3 07/22/2013   CL 104 07/22/2013   CREATININE 1.0 07/22/2013   BUN 13 07/22/2013   CO2 27 07/22/2013   TSH 1.92 07/22/2013   HGBA1C 5.4 07/22/2013       Assessment & Plan:

## 2014-01-14 NOTE — Patient Instructions (Signed)
Hypertriglyceridemia  Diet for High blood levels of Triglycerides Most fats in food are triglycerides. Triglycerides in your blood are stored as fat in your body. High levels of triglycerides in your blood may put you at a greater risk for heart disease and stroke.  Normal triglyceride levels are less than 150 mg/dL. Borderline high levels are 150-199 mg/dl. High levels are 200 - 499 mg/dL, and very high triglyceride levels are greater than 500 mg/dL. The decision to treat high triglycerides is generally based on the level. For people with borderline or high triglyceride levels, treatment includes weight loss and exercise. Drugs are recommended for people with very high triglyceride levels. Many people who need treatment for high triglyceride levels have metabolic syndrome. This syndrome is a collection of disorders that often include: insulin resistance, high blood pressure, blood clotting problems, high cholesterol and triglycerides. TESTING PROCEDURE FOR TRIGLYCERIDES  You should not eat 4 hours before getting your triglycerides measured. The normal range of triglycerides is between 10 and 250 milligrams per deciliter (mg/dl). Some people may have extreme levels (1000 or above), but your triglyceride level may be too high if it is above 150 mg/dl, depending on what other risk factors you have for heart disease.  People with high blood triglycerides may also have high blood cholesterol levels. If you have high blood cholesterol as well as high blood triglycerides, your risk for heart disease is probably greater than if you only had high triglycerides. High blood cholesterol is one of the main risk factors for heart disease. CHANGING YOUR DIET  Your weight can affect your blood triglyceride level. If you are more than 20% above your ideal body weight, you may be able to lower your blood triglycerides by losing weight. Eating less and exercising regularly is the best way to combat this. Fat provides more  calories than any other food. The best way to lose weight is to eat less fat. Only 30% of your total calories should come from fat. Less than 7% of your diet should come from saturated fat. A diet low in fat and saturated fat is the same as a diet to decrease blood cholesterol. By eating a diet lower in fat, you may lose weight, lower your blood cholesterol, and lower your blood triglyceride level.  Eating a diet low in fat, especially saturated fat, may also help you lower your blood triglyceride level. Ask your dietitian to help you figure how much fat you can eat based on the number of calories your caregiver has prescribed for you.  Exercise, in addition to helping with weight loss may also help lower triglyceride levels.   Alcohol can increase blood triglycerides. You may need to stop drinking alcoholic beverages.  Too much carbohydrate in your diet may also increase your blood triglycerides. Some complex carbohydrates are necessary in your diet. These may include bread, rice, potatoes, other starchy vegetables and cereals.  Reduce "simple" carbohydrates. These may include pure sugars, candy, honey, and jelly without losing other nutrients. If you have the kind of high blood triglycerides that is affected by the amount of carbohydrates in your diet, you will need to eat less sugar and less high-sugar foods. Your caregiver can help you with this.  Adding 2-4 grams of fish oil (EPA+ DHA) may also help lower triglycerides. Speak with your caregiver before adding any supplements to your regimen. Following the Diet  Maintain your ideal weight. Your caregivers can help you with a diet. Generally, eating less food and getting more   exercise will help you lose weight. Joining a weight control group may also help. Ask your caregivers for a good weight control group in your area.  Eat low-fat foods instead of high-fat foods. This can help you lose weight too.  These foods are lower in fat. Eat MORE of these:    Dried beans, peas, and lentils.  Egg whites.  Low-fat cottage cheese.  Fish.  Lean cuts of meat, such as round, sirloin, rump, and flank (cut extra fat off meat you fix).  Whole grain breads, cereals and pasta.  Skim and nonfat dry milk.  Low-fat yogurt.  Poultry without the skin.  Cheese made with skim or part-skim milk, such as mozzarella, parmesan, farmers', ricotta, or pot cheese. These are higher fat foods. Eat LESS of these:   Whole milk and foods made from whole milk, such as American, blue, cheddar, monterey jack, and swiss cheese  High-fat meats, such as luncheon meats, sausages, knockwurst, bratwurst, hot dogs, ribs, corned beef, ground pork, and regular ground beef.  Fried foods. Limit saturated fats in your diet. Substituting unsaturated fat for saturated fat may decrease your blood triglyceride level. You will need to read package labels to know which products contain saturated fats.  These foods are high in saturated fat. Eat LESS of these:   Fried pork skins.  Whole milk.  Skin and fat from poultry.  Palm oil.  Butter.  Shortening.  Cream cheese.  Bacon.  Margarines and baked goods made from listed oils.  Vegetable shortenings.  Chitterlings.  Fat from meats.  Coconut oil.  Palm kernel oil.  Lard.  Cream.  Sour cream.  Fatback.  Coffee whiteners and non-dairy creamers made with these oils.  Cheese made from whole milk. Use unsaturated fats (both polyunsaturated and monounsaturated) moderately. Remember, even though unsaturated fats are better than saturated fats; you still want a diet low in total fat.  These foods are high in unsaturated fat:   Canola oil.  Sunflower oil.  Mayonnaise.  Almonds.  Peanuts.  Pine nuts.  Margarines made with these oils.  Safflower oil.  Olive oil.  Avocados.  Cashews.  Peanut butter.  Sunflower seeds.  Soybean oil.  Peanut  oil.  Olives.  Pecans.  Walnuts.  Pumpkin seeds. Avoid sugar and other high-sugar foods. This will decrease carbohydrates without decreasing other nutrients. Sugar in your food goes rapidly to your blood. When there is excess sugar in your blood, your liver may use it to make more triglycerides. Sugar also contains calories without other important nutrients.  Eat LESS of these:   Sugar, brown sugar, powdered sugar, jam, jelly, preserves, honey, syrup, molasses, pies, candy, cakes, cookies, frosting, pastries, colas, soft drinks, punches, fruit drinks, and regular gelatin.  Avoid alcohol. Alcohol, even more than sugar, may increase blood triglycerides. In addition, alcohol is high in calories and low in nutrients. Ask for sparkling water, or a diet soft drink instead of an alcoholic beverage. Suggestions for planning and preparing meals   Bake, broil, grill or roast meats instead of frying.  Remove fat from meats and skin from poultry before cooking.  Add spices, herbs, lemon juice or vinegar to vegetables instead of salt, rich sauces or gravies.  Use a non-stick skillet without fat or use no-stick sprays.  Cool and refrigerate stews and broth. Then remove the hardened fat floating on the surface before serving.  Refrigerate meat drippings and skim off fat to make low-fat gravies.  Serve more fish.  Use less butter,   margarine and other high-fat spreads on bread or vegetables.  Use skim or reconstituted non-fat dry milk for cooking.  Cook with low-fat cheeses.  Substitute low-fat yogurt or cottage cheese for all or part of the sour cream in recipes for sauces, dips or congealed salads.  Use half yogurt/half mayonnaise in salad recipes.  Substitute evaporated skim milk for cream. Evaporated skim milk or reconstituted non-fat dry milk can be whipped and substituted for whipped cream in certain recipes.  Choose fresh fruits for dessert instead of high-fat foods such as pies or  cakes. Fruits are naturally low in fat. When Dining Out   Order low-fat appetizers such as fruit or vegetable juice, pasta with vegetables or tomato sauce.  Select clear, rather than cream soups.  Ask that dressings and gravies be served on the side. Then use less of them.  Order foods that are baked, broiled, poached, steamed, stir-fried, or roasted.  Ask for margarine instead of butter, and use only a small amount.  Drink sparkling water, unsweetened tea or coffee, or diet soft drinks instead of alcohol or other sweet beverages. QUESTIONS AND ANSWERS ABOUT OTHER FATS IN THE BLOOD: SATURATED FAT, TRANS FAT, AND CHOLESTEROL What is trans fat? Trans fat is a type of fat that is formed when vegetable oil is hardened through a process called hydrogenation. This process helps makes foods more solid, gives them shape, and prolongs their shelf life. Trans fats are also called hydrogenated or partially hydrogenated oils.  What do saturated fat, trans fat, and cholesterol in foods have to do with heart disease? Saturated fat, trans fat, and cholesterol in the diet all raise the level of LDL "bad" cholesterol in the blood. The higher the LDL cholesterol, the greater the risk for coronary heart disease (CHD). Saturated fat and trans fat raise LDL similarly.  What foods contain saturated fat, trans fat, and cholesterol? High amounts of saturated fat are found in animal products, such as fatty cuts of meat, chicken skin, and full-fat dairy products like butter, whole milk, cream, and cheese, and in tropical vegetable oils such as palm, palm kernel, and coconut oil. Trans fat is found in some of the same foods as saturated fat, such as vegetable shortening, some margarines (especially hard or stick margarine), crackers, cookies, baked goods, fried foods, salad dressings, and other processed foods made with partially hydrogenated vegetable oils. Small amounts of trans fat also occur naturally in some animal  products, such as milk products, beef, and lamb. Foods high in cholesterol include liver, other organ meats, egg yolks, shrimp, and full-fat dairy products. How can I use the new food label to make heart-healthy food choices? Check the Nutrition Facts panel of the food label. Choose foods lower in saturated fat, trans fat, and cholesterol. For saturated fat and cholesterol, you can also use the Percent Daily Value (%DV): 5% DV or less is low, and 20% DV or more is high. (There is no %DV for trans fat.) Use the Nutrition Facts panel to choose foods low in saturated fat and cholesterol, and if the trans fat is not listed, read the ingredients and limit products that list shortening or hydrogenated or partially hydrogenated vegetable oil, which tend to be high in trans fat. POINTS TO REMEMBER:   Discuss your risk for heart disease with your caregivers, and take steps to reduce risk factors.  Change your diet. Choose foods that are low in saturated fat, trans fat, and cholesterol.  Add exercise to your daily routine if   it is not already being done. Participate in physical activity of moderate intensity, like brisk walking, for at least 30 minutes on most, and preferably all days of the week. No time? Break the 30 minutes into three, 10-minute segments during the day.  Stop smoking. If you do smoke, contact your caregiver to discuss ways in which they can help you quit.  Do not use street drugs.  Maintain a normal weight.  Maintain a healthy blood pressure.  Keep up with your blood work for checking the fats in your blood as directed by your caregiver. Document Released: 08/31/2004 Document Revised: 05/14/2012 Document Reviewed: 03/29/2009 [employer] Patient Information 2014 [employer].  

## 2014-01-30 ENCOUNTER — Ambulatory Visit: Payer: Medicare Other

## 2014-01-30 ENCOUNTER — Ambulatory Visit (INDEPENDENT_AMBULATORY_CARE_PROVIDER_SITE_OTHER): Payer: Medicare Other | Admitting: *Deleted

## 2014-01-30 DIAGNOSIS — E538 Deficiency of other specified B group vitamins: Secondary | ICD-10-CM

## 2014-01-30 MED ORDER — CYANOCOBALAMIN 1000 MCG/ML IJ SOLN
1000.0000 ug | Freq: Once | INTRAMUSCULAR | Status: AC
  Administered 2014-01-30: 1000 ug via INTRAMUSCULAR

## 2014-02-02 ENCOUNTER — Telehealth: Payer: Self-pay | Admitting: *Deleted

## 2014-02-02 NOTE — Telephone Encounter (Signed)
yes

## 2014-02-02 NOTE — Telephone Encounter (Signed)
Patient phoned & stated he is unable to tolerate recently prescribed Lyrica---anxious; SOB.  States the gabapentin was more effective.  Should he stop taking Lyrica until his f/u OV with Tamala Julian on Thursday?  Patient still has gabapentin.  Please advise.  CB#[phone]

## 2014-02-02 NOTE — Telephone Encounter (Signed)
Phoned patient and relayed MD response & recommendation.

## 2014-02-05 ENCOUNTER — Ambulatory Visit (INDEPENDENT_AMBULATORY_CARE_PROVIDER_SITE_OTHER): Payer: Medicare Other | Admitting: Family Medicine

## 2014-02-05 ENCOUNTER — Encounter: Payer: Self-pay | Admitting: Family Medicine

## 2014-02-05 VITALS — BP 132/72 | HR 74 | Temp 98.1°F | Resp 16 | Wt 203.1 lb

## 2014-02-05 DIAGNOSIS — G5700 Lesion of sciatic nerve, unspecified lower limb: Secondary | ICD-10-CM

## 2014-02-05 DIAGNOSIS — M542 Cervicalgia: Secondary | ICD-10-CM

## 2014-02-05 DIAGNOSIS — G5701 Lesion of sciatic nerve, right lower limb: Secondary | ICD-10-CM | POA: Insufficient documentation

## 2014-02-05 MED ORDER — MELOXICAM 15 MG PO TABS: 15.0000 mg | ORAL_TABLET | Freq: Every day | ORAL | Status: DC

## 2014-02-05 MED ORDER — HYDROXYZINE HCL 25 MG PO TABS: 25.0000 mg | ORAL_TABLET | Freq: Three times a day (TID) | ORAL | Status: DC | PRN

## 2014-02-05 NOTE — Progress Notes (Signed)
[facility] [address] [place], [place] [address] Phone: [phone] Subjective:      CC: Left shoulder pain, neck pain follow up.  FBP:[id number] Nicholas Buck is a 73 y.o. male coming in with complaint of left shoulder pain. 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 Patient though has been doing home exercises as well as taking gabapentin secondary to side effects of Lyrica. Patient states that he is doing approximately 85% better. Patient is able to play golf and do all his activities of daily living without any significant discomfort.  Patient is complaining of a new problem no. Patient does have right buttock pain that seems to be intermittent worse with sitting. Patient states that his right hip as well as the tighter than his left hip. Denies any radiation down the leg or any numbness or tingling. Patient denies any bowel or bladder incontinence. Patient still able to do all activities of daily living but does have this intermittent sharp pain that seems to get better on its own. Severity is 6/10.  Past medical history, social, surgical and family history all reviewed in electronic medical record.   Review of Systems: No headache, visual changes, nausea, vomiting, diarrhea, constipation, dizziness, abdominal pain, skin rash, fevers, chills, night sweats, weight loss, swollen lymph nodes, body aches, joint swelling, muscle aches, chest pain, shortness of breath, mood changes.   Objective Vitals reviewed.  Blood pressure 132/72, pulse 74, temperature 98.1 F (36.7 C), temperature source Oral, resp. rate 16, weight 203 lb 1.9 oz (92.135 kg), SpO2 91.00%.   General: No apparent distress alert and oriented x3 mood and affect normal, dressed appropriately.  HEENT: Pupils equal, extraocular movements intact  Respiratory: Patient's speak in full  sentences and does not appear short of breath  Cardiovascular: No lower extremity edema, non tender, no erythema  Skin: Warm dry intact with no signs of infection or rash on extremities or on axial skeleton.  Abdomen: Soft nontender  Neuro: Cranial nerves II through XII are intact, neurovascularly intact in all extremities with 2+ DTRs and 2+ pulses.  Lymph: No lymphadenopathy of posterior or anterior cervical chain or axillae bilaterally.  Gait normal with good balance and coordination.  MSK: Non tender with full range of motion and good stability and symmetric strength and tone of elbows, wrist, knee and ankles bilaterally.    Neck: Inspection unremarkable. No palpable stepoffs. Still mild positive Spurling's maneuver. Good range of motion with side bending to the left as well as rotation to the left. Grip strength and sensation normal in bilateral hands Strength shows weakness of the C4 nerve root distribution No sensory change to C4 to T1 Negative Hoffman sign bilaterally Reflexes normal  X-rays were reviewed previously Patient does have 1. 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 Multilevel bilateral prominent neural foraminal narrowing is present.  2. Fusion of C6 - C7.  Hip: Right ROM IR: 35 Deg, ER: 45 Deg, Flexion: 120 Deg, Extension: 100 Deg, Abduction: 45 Deg, Adduction: 45 Deg Strength IR: 5/5, ER: 5/5, Flexion: 5/5, Extension: 5/5, Abduction: 3/5, Adduction: 4/5 Pelvic alignment unremarkable to inspection and palpation. Standing hip rotation and gait without trendelenburg sign / unsteadiness. Greater trochanter without tenderness to palpation. No tenderness over greater trochanter. Mild tenderness over the piriformis.  No pain with FADIR. Positive Faber No SI joint tenderness and normal minimal SI movement.  contralateral  hip unremarkable    Impression and Recommendations:     This case required medical decision making of moderate  complexity.

## 2014-02-05 NOTE — Progress Notes (Signed)
Pre visit review using our clinic review tool, if applicable. No additional management support is needed unless otherwise documented below in the visit note. 

## 2014-02-05 NOTE — Assessment & Plan Note (Signed)

## 2014-02-05 NOTE — Assessment & Plan Note (Signed)
Significant arthritis. Patient does have significant effusion today. We have streamlined his medicine and will only be taking gabapentin and we discussed trying to titrate down to 400 mg if possible. Patient will try this and come back on an as-needed basis. Encourage him to continue the home exercises.

## 2014-02-05 NOTE — Patient Instructions (Signed)
Good to see you I am glad you are doing better.  Continue neck and shoulder exercises 3 times a week Try hip exercises other 3 days a week.  Tennis ball in back right pocket.  Continue the gabapentin if it is working.  Talk to Dr. Ronnald Ramp about the atorvastatin  See me when you need me.

## 2014-02-18 ENCOUNTER — Ambulatory Visit: Payer: Medicare Other | Admitting: Internal Medicine

## 2014-03-03 ENCOUNTER — Ambulatory Visit (INDEPENDENT_AMBULATORY_CARE_PROVIDER_SITE_OTHER): Payer: Medicare Other | Admitting: *Deleted

## 2014-03-03 DIAGNOSIS — E538 Deficiency of other specified B group vitamins: Secondary | ICD-10-CM

## 2014-03-03 DIAGNOSIS — G63 Polyneuropathy in diseases classified elsewhere: Secondary | ICD-10-CM

## 2014-03-03 MED ORDER — CYANOCOBALAMIN 1000 MCG/ML IJ SOLN
1000.0000 ug | Freq: Once | INTRAMUSCULAR | Status: AC
  Administered 2014-03-03: 1000 ug via INTRAMUSCULAR

## 2014-04-02 ENCOUNTER — Ambulatory Visit (INDEPENDENT_AMBULATORY_CARE_PROVIDER_SITE_OTHER): Payer: Medicare Other | Admitting: *Deleted

## 2014-04-02 DIAGNOSIS — G63 Polyneuropathy in diseases classified elsewhere: Secondary | ICD-10-CM

## 2014-04-02 DIAGNOSIS — E538 Deficiency of other specified B group vitamins: Secondary | ICD-10-CM

## 2014-04-02 MED ORDER — CYANOCOBALAMIN 1000 MCG/ML IJ SOLN
1000.0000 ug | Freq: Once | INTRAMUSCULAR | Status: AC
  Administered 2014-04-02: 1000 ug via INTRAMUSCULAR

## 2014-05-12 ENCOUNTER — Ambulatory Visit (INDEPENDENT_AMBULATORY_CARE_PROVIDER_SITE_OTHER): Payer: Medicare Other | Admitting: *Deleted

## 2014-05-12 DIAGNOSIS — G63 Polyneuropathy in diseases classified elsewhere: Secondary | ICD-10-CM

## 2014-05-12 DIAGNOSIS — E538 Deficiency of other specified B group vitamins: Secondary | ICD-10-CM

## 2014-05-12 MED ORDER — CYANOCOBALAMIN 1000 MCG/ML IJ SOLN
1000.0000 ug | Freq: Once | INTRAMUSCULAR | Status: AC
  Administered 2014-05-12: 1000 ug via INTRAMUSCULAR

## 2014-05-25 ENCOUNTER — Telehealth: Payer: Self-pay | Admitting: Internal Medicine

## 2014-05-25 DIAGNOSIS — E785 Hyperlipidemia, unspecified: Secondary | ICD-10-CM

## 2014-05-25 DIAGNOSIS — E538 Deficiency of other specified B group vitamins: Secondary | ICD-10-CM

## 2014-05-25 DIAGNOSIS — G63 Polyneuropathy in diseases classified elsewhere: Secondary | ICD-10-CM

## 2014-05-25 MED ORDER — ATORVASTATIN CALCIUM 20 MG PO TABS: 20.0000 mg | ORAL_TABLET | Freq: Every day | ORAL | Status: DC

## 2014-05-25 MED ORDER — GABAPENTIN 800 MG PO TABS: 1600.0000 mg | ORAL_TABLET | Freq: Two times a day (BID) | ORAL | Status: DC

## 2014-05-25 NOTE — Telephone Encounter (Signed)
Patient called to request having his atorvastatin (LIPITOR) 40 MG tablet refilled. He is currently taking half a tablet a day and is asking for the refill that is called in to be for 20mg  instead. Please advise.    Patient also needs a refill on his gabapentin (NEURONTIN) 800 MG tablet and is asking for his dosage to be increased so that he takes 1600mg  twice a day.

## 2014-05-25 NOTE — Telephone Encounter (Signed)
done

## 2014-06-11 ENCOUNTER — Ambulatory Visit: Payer: Medicare Other

## 2014-06-22 ENCOUNTER — Ambulatory Visit (INDEPENDENT_AMBULATORY_CARE_PROVIDER_SITE_OTHER): Payer: Medicare Other

## 2014-06-22 DIAGNOSIS — E538 Deficiency of other specified B group vitamins: Secondary | ICD-10-CM

## 2014-06-22 DIAGNOSIS — G63 Polyneuropathy in diseases classified elsewhere: Secondary | ICD-10-CM

## 2014-06-22 MED ORDER — CYANOCOBALAMIN 1000 MCG/ML IJ SOLN
1000.0000 ug | Freq: Once | INTRAMUSCULAR | Status: AC
  Administered 2014-06-22: 1000 ug via INTRAMUSCULAR

## 2014-07-14 ENCOUNTER — Ambulatory Visit: Payer: Medicare Other | Admitting: Internal Medicine

## 2014-07-20 ENCOUNTER — Ambulatory Visit: Payer: Medicare Other

## 2014-07-28 ENCOUNTER — Encounter: Payer: Self-pay | Admitting: Internal Medicine

## 2014-07-28 ENCOUNTER — Ambulatory Visit (INDEPENDENT_AMBULATORY_CARE_PROVIDER_SITE_OTHER): Payer: Medicare Other | Admitting: Internal Medicine

## 2014-07-28 ENCOUNTER — Other Ambulatory Visit (INDEPENDENT_AMBULATORY_CARE_PROVIDER_SITE_OTHER): Payer: Medicare Other

## 2014-07-28 VITALS — BP 126/80 | HR 65 | Temp 98.0°F | Resp 16 | Wt 197.0 lb

## 2014-07-28 DIAGNOSIS — G63 Polyneuropathy in diseases classified elsewhere: Secondary | ICD-10-CM

## 2014-07-28 DIAGNOSIS — E785 Hyperlipidemia, unspecified: Secondary | ICD-10-CM

## 2014-07-28 DIAGNOSIS — Z23 Encounter for immunization: Secondary | ICD-10-CM

## 2014-07-28 DIAGNOSIS — E538 Deficiency of other specified B group vitamins: Secondary | ICD-10-CM

## 2014-07-28 LAB — LIPID PANEL
Cholesterol: 162 mg/dL (ref 0–200)
HDL: 31.1 mg/dL — AB (ref >39.00)
NonHDL: 130.9
TRIGLYCERIDES: 243 mg/dL — AB (ref 0.0–149.0)
Total CHOL/HDL Ratio: 5
VLDL: 48.6 mg/dL — ABNORMAL HIGH (ref 0.0–40.0)

## 2014-07-28 LAB — COMPREHENSIVE METABOLIC PANEL
ALT: 30 U/L (ref 0–53)
AST: 26 U/L (ref 0–37)
Albumin: 4.5 g/dL (ref 3.5–5.2)
Alkaline Phosphatase: 89 U/L (ref 39–117)
BILIRUBIN TOTAL: 1.3 mg/dL — AB (ref 0.2–1.2)
BUN: 13 mg/dL (ref 6–23)
CALCIUM: 9.9 mg/dL (ref 8.4–10.5)
CHLORIDE: 102 meq/L (ref 96–112)
CO2: 31 mEq/L (ref 19–32)
CREATININE: 1 mg/dL (ref 0.4–1.5)
GFR: 81.44 mL/min (ref >60.00)
Glucose, Bld: 102 mg/dL — ABNORMAL HIGH (ref 70–99)
Potassium: 5.2 mEq/L — ABNORMAL HIGH (ref 3.5–5.1)
Sodium: 138 mEq/L (ref 135–145)
Total Protein: 7.6 g/dL (ref 6.0–8.3)

## 2014-07-28 LAB — LDL CHOLESTEROL, DIRECT: Direct LDL: 103.9 mg/dL

## 2014-07-28 LAB — TSH: TSH: 2.62 u[IU]/mL (ref 0.35–4.50)

## 2014-07-28 MED ORDER — CYANOCOBALAMIN 1000 MCG/ML IJ SOLN
1000.0000 ug | Freq: Once | INTRAMUSCULAR | Status: AC
  Administered 2014-07-28: 1000 ug via INTRAMUSCULAR

## 2014-07-28 NOTE — Progress Notes (Signed)
Pre visit review using our clinic review tool, if applicable. No additional management support is needed unless otherwise documented below in the visit note. 

## 2014-07-28 NOTE — Progress Notes (Signed)
   Subjective:    Patient ID: Nicholas Buck, male    DOB: 1941-02-24, 73 y.o.   MRN: [id number]  Hyperlipidemia This is a chronic problem. The current episode started more than 1 year ago. The problem is controlled. Recent lipid tests were reviewed and are normal. He has no history of chronic renal disease, diabetes, hypothyroidism, liver disease, obesity or nephrotic syndrome. There are no known factors aggravating his hyperlipidemia. Pertinent negatives include no chest pain, focal sensory loss, focal weakness, leg pain, myalgias or shortness of breath. Current antihyperlipidemic treatment includes statins. The current treatment provides significant improvement of lipids. There are no compliance problems.       Review of Systems  Constitutional: Negative.   HENT: Negative.   Eyes: Negative.   Respiratory: Negative.  Negative for cough, choking, chest tightness, shortness of breath and stridor.   Cardiovascular: Negative.  Negative for chest pain, palpitations and leg swelling.  Gastrointestinal: Negative.  Negative for nausea, vomiting, abdominal pain, diarrhea, constipation and blood in stool.  Endocrine: Negative.   Genitourinary: Negative.   Musculoskeletal: Negative.  Negative for arthralgias, back pain, myalgias, neck pain and neck stiffness.  Skin: Negative.  Negative for rash.  Allergic/Immunologic: Negative.   Neurological: Negative.  Negative for dizziness, focal weakness, light-headedness and headaches.  Hematological: Negative.  Negative for adenopathy. Does not bruise/bleed easily.  Psychiatric/Behavioral: Negative.        Objective:   Physical Exam  Vitals reviewed. Constitutional: He is oriented to person, place, and time. He appears well-developed and well-nourished. No distress.  HENT:  Head: Normocephalic and atraumatic.  Mouth/Throat: No oropharyngeal exudate.  Eyes: Conjunctivae are normal. Right eye exhibits no discharge. Left eye exhibits no discharge. No  scleral icterus.  Neck: Normal range of motion. Neck supple. No JVD present. No tracheal deviation present. No thyromegaly present.  Cardiovascular: Normal rate, regular rhythm, normal heart sounds and intact distal pulses.  Exam reveals no gallop and no friction rub.   No murmur heard. Pulmonary/Chest: Effort normal and breath sounds normal. No stridor. No respiratory distress. He has no wheezes. He has no rales. He exhibits no tenderness.  Abdominal: Soft. Bowel sounds are normal. He exhibits no distension and no mass. There is no tenderness. There is no rebound and no guarding.  Musculoskeletal: Normal range of motion. He exhibits no edema and no tenderness.  Lymphadenopathy:    He has no cervical adenopathy.  Neurological: He is oriented to person, place, and time.  Skin: Skin is warm and dry. No rash noted. He is not diaphoretic. No erythema. No pallor.  Psychiatric: He has a normal mood and affect. His behavior is normal. Judgment and thought content normal.     Lab Results  Component Value Date   WBC 6.4 06/18/2013   HGB 14.8 06/18/2013   HCT 43.3 06/18/2013   PLT 163.0 06/18/2013   GLUCOSE 99 01/14/2014   CHOL 197 01/14/2014   TRIG 207.0* 01/14/2014   HDL 33.00* 01/14/2014   LDLDIRECT 135.3 01/14/2014   ALT 24 01/14/2014   AST 15 01/14/2014   NA 137 01/14/2014   K 4.7 01/14/2014   CL 103 01/14/2014   CREATININE 1.0 01/14/2014   BUN 17 01/14/2014   CO2 28 01/14/2014   TSH 1.92 07/22/2013   HGBA1C 5.4 07/22/2013       Assessment & Plan:

## 2014-07-28 NOTE — Patient Instructions (Signed)

## 2014-07-30 NOTE — Assessment & Plan Note (Signed)
He has achieved his LDL goal and is doing well on lipitor

## 2014-09-21 ENCOUNTER — Ambulatory Visit (INDEPENDENT_AMBULATORY_CARE_PROVIDER_SITE_OTHER): Payer: Medicare Other | Admitting: *Deleted

## 2014-09-21 DIAGNOSIS — E538 Deficiency of other specified B group vitamins: Secondary | ICD-10-CM

## 2014-09-21 DIAGNOSIS — G63 Polyneuropathy in diseases classified elsewhere: Principal | ICD-10-CM

## 2014-09-21 MED ORDER — CYANOCOBALAMIN 1000 MCG/ML IJ SOLN
1000.0000 ug | Freq: Once | INTRAMUSCULAR | Status: AC
  Administered 2014-09-21: 1000 ug via INTRAMUSCULAR

## 2014-10-19 ENCOUNTER — Ambulatory Visit (INDEPENDENT_AMBULATORY_CARE_PROVIDER_SITE_OTHER): Payer: Medicare Other | Admitting: *Deleted

## 2014-10-19 DIAGNOSIS — E538 Deficiency of other specified B group vitamins: Secondary | ICD-10-CM

## 2014-10-19 MED ORDER — CYANOCOBALAMIN 1000 MCG/ML IJ SOLN
1000.0000 ug | Freq: Once | INTRAMUSCULAR | Status: AC
  Administered 2014-10-19: 1000 ug via INTRAMUSCULAR

## 2014-11-13 ENCOUNTER — Ambulatory Visit (INDEPENDENT_AMBULATORY_CARE_PROVIDER_SITE_OTHER): Payer: Medicare Other | Admitting: *Deleted

## 2014-11-13 DIAGNOSIS — E538 Deficiency of other specified B group vitamins: Secondary | ICD-10-CM

## 2014-11-13 MED ORDER — CYANOCOBALAMIN 1000 MCG/ML IJ SOLN
1000.0000 ug | Freq: Once | INTRAMUSCULAR | Status: AC
  Administered 2014-11-13: 1000 ug via INTRAMUSCULAR

## 2014-12-14 ENCOUNTER — Ambulatory Visit: Payer: Medicare Other

## 2014-12-16 ENCOUNTER — Ambulatory Visit (INDEPENDENT_AMBULATORY_CARE_PROVIDER_SITE_OTHER): Payer: Medicare Other

## 2014-12-16 DIAGNOSIS — E538 Deficiency of other specified B group vitamins: Secondary | ICD-10-CM

## 2014-12-16 MED ORDER — CYANOCOBALAMIN 1000 MCG/ML IJ SOLN
1000.0000 ug | Freq: Once | INTRAMUSCULAR | Status: AC
  Administered 2014-12-16: 1000 ug via INTRAMUSCULAR

## 2015-01-15 ENCOUNTER — Ambulatory Visit (INDEPENDENT_AMBULATORY_CARE_PROVIDER_SITE_OTHER): Payer: Medicare Other

## 2015-01-15 DIAGNOSIS — E538 Deficiency of other specified B group vitamins: Secondary | ICD-10-CM

## 2015-01-15 DIAGNOSIS — G63 Polyneuropathy in diseases classified elsewhere: Principal | ICD-10-CM

## 2015-01-15 MED ORDER — CYANOCOBALAMIN 1000 MCG/ML IJ SOLN
1000.0000 ug | Freq: Once | INTRAMUSCULAR | Status: AC
  Administered 2015-01-15: 1000 ug via INTRAMUSCULAR

## 2015-02-05 ENCOUNTER — Ambulatory Visit (INDEPENDENT_AMBULATORY_CARE_PROVIDER_SITE_OTHER): Payer: Medicare Other | Admitting: Internal Medicine

## 2015-02-05 ENCOUNTER — Encounter: Payer: Self-pay | Admitting: Internal Medicine

## 2015-02-05 VITALS — BP 120/82 | HR 73 | Temp 98.5°F | Resp 16 | Ht 73.0 in | Wt 196.0 lb

## 2015-02-05 DIAGNOSIS — J302 Other seasonal allergic rhinitis: Secondary | ICD-10-CM | POA: Diagnosis not present

## 2015-02-05 DIAGNOSIS — F5105 Insomnia due to other mental disorder: Secondary | ICD-10-CM | POA: Diagnosis not present

## 2015-02-05 DIAGNOSIS — M545 Low back pain, unspecified: Secondary | ICD-10-CM

## 2015-02-05 DIAGNOSIS — F409 Phobic anxiety disorder, unspecified: Secondary | ICD-10-CM | POA: Diagnosis not present

## 2015-02-05 DIAGNOSIS — J0101 Acute recurrent maxillary sinusitis: Secondary | ICD-10-CM | POA: Diagnosis not present

## 2015-02-05 DIAGNOSIS — J0100 Acute maxillary sinusitis, unspecified: Secondary | ICD-10-CM | POA: Insufficient documentation

## 2015-02-05 MED ORDER — METHYLPREDNISOLONE 4 MG PO KIT: PACK | ORAL | Status: DC

## 2015-02-05 MED ORDER — CEFUROXIME AXETIL 500 MG PO TABS: 500.0000 mg | ORAL_TABLET | Freq: Two times a day (BID) | ORAL | Status: AC

## 2015-02-05 MED ORDER — FLUTICASONE PROPIONATE 50 MCG/ACT NA SUSP: 2.0000 | Freq: Every day | NASAL | Status: DC

## 2015-02-05 MED ORDER — TIZANIDINE HCL 2 MG PO CAPS: 2.0000 mg | ORAL_CAPSULE | Freq: Three times a day (TID) | ORAL | Status: DC | PRN

## 2015-02-05 MED ORDER — TRAZODONE HCL 100 MG PO TABS: 100.0000 mg | ORAL_TABLET | Freq: Every day | ORAL | Status: DC

## 2015-02-05 NOTE — Patient Instructions (Signed)

## 2015-02-05 NOTE — Progress Notes (Signed)
Pre visit review using our clinic review tool, if applicable. No additional management support is needed unless otherwise documented below in the visit note. 

## 2015-02-07 NOTE — Assessment & Plan Note (Signed)
Will treat the infection with ceftin

## 2015-02-07 NOTE — Assessment & Plan Note (Signed)
He has some eustachian tube dysfunction so with start systemic steroids and steroid nasal sprays

## 2015-02-07 NOTE — Assessment & Plan Note (Signed)
Will start trazodone at a low dose Will increase the dose as indicated

## 2015-02-07 NOTE — Assessment & Plan Note (Signed)
Will try zanaflex for this

## 2015-02-07 NOTE — Progress Notes (Signed)
Subjective:    Patient ID: Nicholas Buck, male    DOB: December 02, 1940, 74 y.o.   MRN: [id number]  Sinusitis This is a recurrent problem. The current episode started 1 to 4 weeks ago. The problem has been gradually worsening since onset. There has been no fever. The fever has been present for less than 1 day. His pain is at a severity of 0/10. He is experiencing no pain. Associated symptoms include congestion, ear pain ("pain and popping in left ear" ), sinus pressure and sneezing. Pertinent negatives include no chills, coughing, diaphoresis, headaches, hoarse voice, neck pain, shortness of breath, sore throat or swollen glands. Past treatments include nothing. The treatment provided no relief.      Review of Systems  Constitutional: Negative.  Negative for fever, chills, diaphoresis, activity change, appetite change, fatigue and unexpected weight change.  HENT: Positive for congestion, ear pain ("pain and popping in left ear" ), postnasal drip, rhinorrhea, sinus pressure and sneezing. Negative for dental problem, drooling, ear discharge, facial swelling, hearing loss, hoarse voice, mouth sores, nosebleeds, sore throat, trouble swallowing and voice change.   Eyes: Negative.   Respiratory: Negative.  Negative for cough, choking, chest tightness, shortness of breath and stridor.   Cardiovascular: Negative.  Negative for chest pain and leg swelling.  Gastrointestinal: Negative.  Negative for nausea, vomiting, abdominal pain, diarrhea, constipation and blood in stool.  Endocrine: Negative.   Genitourinary: Negative.   Musculoskeletal: Positive for back pain. Negative for arthralgias and neck pain.       He has chronic intermittent LBP that responds well to muscle relaxants  Skin: Negative.   Allergic/Immunologic: Negative.   Neurological: Negative.  Negative for headaches.  Hematological: Negative.  Negative for adenopathy. Does not bruise/bleed easily.  Psychiatric/Behavioral: Positive for sleep  disturbance (DFA and FA) and dysphoric mood (he feels irritable and cranky). Negative for suicidal ideas, hallucinations, behavioral problems, confusion, self-injury, decreased concentration and agitation. The patient is nervous/anxious. The patient is not hyperactive.        Objective:   Physical Exam  Constitutional: He is oriented to person, place, and time. He appears well-developed and well-nourished.  Non-toxic appearance. He does not have a sickly appearance. He does not appear ill. No distress.  HENT:  Right Ear: Hearing, tympanic membrane, external ear and ear canal normal.  Left Ear: Hearing, tympanic membrane, external ear and ear canal normal.  Nose: Mucosal edema and rhinorrhea present. No nose lacerations, sinus tenderness, nasal deformity, septal deviation or nasal septal hematoma. No epistaxis.  No foreign bodies. Right sinus exhibits maxillary sinus tenderness. Right sinus exhibits no frontal sinus tenderness. Left sinus exhibits no maxillary sinus tenderness and no frontal sinus tenderness.  Mouth/Throat: Oropharynx is clear and moist and mucous membranes are normal. Mucous membranes are not pale, not dry and not cyanotic. No oral lesions. No trismus in the jaw. No uvula swelling. No oropharyngeal exudate, posterior oropharyngeal edema, posterior oropharyngeal erythema or tonsillar abscesses.  Eyes: Conjunctivae are normal. Right eye exhibits no discharge. Left eye exhibits no discharge. No scleral icterus.  Neck: Normal range of motion. Neck supple. No JVD present. No tracheal deviation present. No thyromegaly present.  Cardiovascular: Normal rate, regular rhythm, normal heart sounds and intact distal pulses.  Exam reveals no gallop and no friction rub.   No murmur heard. Pulmonary/Chest: Effort normal and breath sounds normal. No stridor. No respiratory distress. He has no wheezes. He has no rales. He exhibits no tenderness.  Abdominal: Soft. Bowel sounds are normal.  He exhibits  no distension and no mass. There is no tenderness. There is no rebound and no guarding.  Musculoskeletal: Normal range of motion. He exhibits no edema or tenderness.  Lymphadenopathy:    He has no cervical adenopathy.  Neurological: He is oriented to person, place, and time.  Skin: Skin is warm and dry. No rash noted. He is not diaphoretic. No erythema. No pallor.  Psychiatric: He has a normal mood and affect. His behavior is normal. Judgment and thought content normal.  Vitals reviewed.    Lab Results  Component Value Date   WBC 6.4 06/18/2013   HGB 14.8 06/18/2013   HCT 43.3 06/18/2013   PLT 163.0 06/18/2013   GLUCOSE 102* 07/28/2014   CHOL 162 07/28/2014   TRIG 243.0* 07/28/2014   HDL 31.10* 07/28/2014   LDLDIRECT 103.9 07/28/2014   ALT 30 07/28/2014   AST 26 07/28/2014   NA 138 07/28/2014   K 5.2* 07/28/2014   CL 102 07/28/2014   CREATININE 1.0 07/28/2014   BUN 13 07/28/2014   CO2 31 07/28/2014   TSH 2.62 07/28/2014   HGBA1C 5.4 07/22/2013       Assessment & Plan:

## 2015-02-15 ENCOUNTER — Ambulatory Visit: Payer: Medicare Other

## 2015-02-16 ENCOUNTER — Ambulatory Visit (INDEPENDENT_AMBULATORY_CARE_PROVIDER_SITE_OTHER): Payer: Medicare Other | Admitting: *Deleted

## 2015-02-16 DIAGNOSIS — E538 Deficiency of other specified B group vitamins: Secondary | ICD-10-CM | POA: Diagnosis not present

## 2015-02-16 MED ORDER — CYANOCOBALAMIN 1000 MCG/ML IJ SOLN
1000.0000 ug | Freq: Once | INTRAMUSCULAR | Status: AC
  Administered 2015-02-16: 1000 ug via INTRAMUSCULAR

## 2015-03-19 ENCOUNTER — Ambulatory Visit (INDEPENDENT_AMBULATORY_CARE_PROVIDER_SITE_OTHER): Payer: Medicare Other

## 2015-03-19 DIAGNOSIS — E538 Deficiency of other specified B group vitamins: Secondary | ICD-10-CM | POA: Diagnosis not present

## 2015-03-19 DIAGNOSIS — G63 Polyneuropathy in diseases classified elsewhere: Principal | ICD-10-CM

## 2015-03-19 MED ORDER — CYANOCOBALAMIN 1000 MCG/ML IJ SOLN
1000.0000 ug | Freq: Once | INTRAMUSCULAR | Status: AC
  Administered 2015-03-19: 1000 ug via INTRAMUSCULAR

## 2015-04-19 ENCOUNTER — Ambulatory Visit (INDEPENDENT_AMBULATORY_CARE_PROVIDER_SITE_OTHER): Payer: Medicare Other | Admitting: *Deleted

## 2015-04-19 DIAGNOSIS — E538 Deficiency of other specified B group vitamins: Secondary | ICD-10-CM

## 2015-04-19 MED ORDER — CYANOCOBALAMIN 1000 MCG/ML IJ SOLN
1000.0000 ug | Freq: Once | INTRAMUSCULAR | Status: AC
  Administered 2015-04-19: 1000 ug via INTRAMUSCULAR

## 2015-05-20 ENCOUNTER — Ambulatory Visit (INDEPENDENT_AMBULATORY_CARE_PROVIDER_SITE_OTHER): Payer: Medicare Other | Admitting: *Deleted

## 2015-05-20 DIAGNOSIS — E538 Deficiency of other specified B group vitamins: Secondary | ICD-10-CM

## 2015-05-20 MED ORDER — CYANOCOBALAMIN 1000 MCG/ML IJ SOLN
1000.0000 ug | Freq: Once | INTRAMUSCULAR | Status: AC
  Administered 2015-05-20: 1000 ug via INTRAMUSCULAR

## 2015-05-28 ENCOUNTER — Telehealth: Payer: Self-pay | Admitting: Internal Medicine

## 2015-05-28 DIAGNOSIS — G63 Polyneuropathy in diseases classified elsewhere: Principal | ICD-10-CM

## 2015-05-28 DIAGNOSIS — E538 Deficiency of other specified B group vitamins: Secondary | ICD-10-CM

## 2015-05-28 MED ORDER — GABAPENTIN 800 MG PO TABS: 1600.0000 mg | ORAL_TABLET | Freq: Two times a day (BID) | ORAL | Status: DC

## 2015-05-28 NOTE — Telephone Encounter (Signed)
Patient is requesting refill on gabapentin to be sent to [facility] on [address].

## 2015-05-28 NOTE — Telephone Encounter (Signed)
Approved.  

## 2015-06-21 ENCOUNTER — Ambulatory Visit (INDEPENDENT_AMBULATORY_CARE_PROVIDER_SITE_OTHER): Payer: Medicare Other

## 2015-06-21 DIAGNOSIS — D649 Anemia, unspecified: Secondary | ICD-10-CM | POA: Diagnosis not present

## 2015-06-21 MED ORDER — CYANOCOBALAMIN 1000 MCG/ML IJ SOLN
1000.0000 ug | Freq: Once | INTRAMUSCULAR | Status: AC
  Administered 2015-06-21: 1000 ug via INTRAMUSCULAR

## 2015-06-27 ENCOUNTER — Other Ambulatory Visit: Payer: Self-pay | Admitting: Internal Medicine

## 2015-07-20 ENCOUNTER — Other Ambulatory Visit (INDEPENDENT_AMBULATORY_CARE_PROVIDER_SITE_OTHER): Payer: Medicare Other

## 2015-07-20 ENCOUNTER — Encounter: Payer: Self-pay | Admitting: Internal Medicine

## 2015-07-20 ENCOUNTER — Ambulatory Visit (INDEPENDENT_AMBULATORY_CARE_PROVIDER_SITE_OTHER): Payer: Medicare Other | Admitting: Internal Medicine

## 2015-07-20 VITALS — BP 150/78 | HR 73 | Temp 97.6°F | Resp 16 | Ht 73.0 in | Wt 195.0 lb

## 2015-07-20 DIAGNOSIS — D509 Iron deficiency anemia, unspecified: Secondary | ICD-10-CM | POA: Diagnosis not present

## 2015-07-20 DIAGNOSIS — E785 Hyperlipidemia, unspecified: Secondary | ICD-10-CM | POA: Diagnosis not present

## 2015-07-20 DIAGNOSIS — G609 Hereditary and idiopathic neuropathy, unspecified: Secondary | ICD-10-CM

## 2015-07-20 DIAGNOSIS — G63 Polyneuropathy in diseases classified elsewhere: Secondary | ICD-10-CM

## 2015-07-20 DIAGNOSIS — Z23 Encounter for immunization: Secondary | ICD-10-CM

## 2015-07-20 DIAGNOSIS — R739 Hyperglycemia, unspecified: Secondary | ICD-10-CM

## 2015-07-20 DIAGNOSIS — E538 Deficiency of other specified B group vitamins: Secondary | ICD-10-CM | POA: Diagnosis not present

## 2015-07-20 DIAGNOSIS — I10 Essential (primary) hypertension: Secondary | ICD-10-CM

## 2015-07-20 LAB — BASIC METABOLIC PANEL
BUN: 13 mg/dL (ref 6–23)
CHLORIDE: 101 meq/L (ref 96–112)
CO2: 29 meq/L (ref 19–32)
CREATININE: 0.89 mg/dL (ref 0.40–1.50)
Calcium: 9.9 mg/dL (ref 8.4–10.5)
GFR: 88.64 mL/min (ref >60.00)
Glucose, Bld: 84 mg/dL (ref 70–99)
POTASSIUM: 4.4 meq/L (ref 3.5–5.1)
Sodium: 137 mEq/L (ref 135–145)

## 2015-07-20 LAB — URINALYSIS, ROUTINE W REFLEX MICROSCOPIC
BILIRUBIN URINE: NEGATIVE
HGB URINE DIPSTICK: NEGATIVE
KETONES UR: NEGATIVE
LEUKOCYTES UA: NEGATIVE
NITRITE: NEGATIVE
RBC / HPF: NONE SEEN (ref 0–?)
SPECIFIC GRAVITY, URINE: 1.02 (ref 1.000–1.030)
Urine Glucose: NEGATIVE
Urobilinogen, UA: 0.2 (ref 0.0–1.0)
pH: 5.5 (ref 5.0–8.0)

## 2015-07-20 LAB — CBC WITH DIFFERENTIAL/PLATELET
BASOS ABS: 0 10*3/uL (ref 0.0–0.1)
Basophils Relative: 0.5 % (ref 0.0–3.0)
EOS ABS: 0.2 10*3/uL (ref 0.0–0.7)
Eosinophils Relative: 3.4 % (ref 0.0–5.0)
HEMATOCRIT: 44 % (ref 39.0–52.0)
Hemoglobin: 15.1 g/dL (ref 13.0–17.0)
LYMPHS PCT: 30.5 % (ref 12.0–46.0)
Lymphs Abs: 1.8 10*3/uL (ref 0.7–4.0)
MCHC: 34.3 g/dL (ref 30.0–36.0)
MCV: 94.1 fl (ref 78.0–100.0)
MONOS PCT: 9.2 % (ref 3.0–12.0)
Monocytes Absolute: 0.5 10*3/uL (ref 0.1–1.0)
NEUTROS PCT: 56.4 % (ref 43.0–77.0)
Neutro Abs: 3.3 10*3/uL (ref 1.4–7.7)
Platelets: 154 10*3/uL (ref 150.0–400.0)
RBC: 4.67 Mil/uL (ref 4.22–5.81)
RDW: 13.6 % (ref 11.5–15.5)
WBC: 5.9 10*3/uL (ref 4.0–10.5)

## 2015-07-20 LAB — LIPID PANEL
CHOL/HDL RATIO: 5
Cholesterol: 165 mg/dL (ref 0–200)
HDL: 34.1 mg/dL — ABNORMAL LOW (ref >39.00)
LDL Cholesterol: 96 mg/dL (ref 0–99)
NONHDL: 130.92
Triglycerides: 177 mg/dL — ABNORMAL HIGH (ref 0.0–149.0)
VLDL: 35.4 mg/dL (ref 0.0–40.0)

## 2015-07-20 LAB — TSH: TSH: 1.72 u[IU]/mL (ref 0.35–4.50)

## 2015-07-20 LAB — HEMOGLOBIN A1C: HEMOGLOBIN A1C: 5.6 % (ref 4.6–6.5)

## 2015-07-20 MED ORDER — AZILSARTAN MEDOXOMIL 40 MG PO TABS: 1.0000 | ORAL_TABLET | Freq: Every day | ORAL | Status: DC

## 2015-07-20 MED ORDER — CYANOCOBALAMIN 1000 MCG/ML IJ SOLN
1000.0000 ug | Freq: Once | INTRAMUSCULAR | Status: AC
  Administered 2015-07-20: 1000 ug via INTRAMUSCULAR

## 2015-07-20 NOTE — Progress Notes (Signed)
Pre visit review using our clinic review tool, if applicable. No additional management support is needed unless otherwise documented below in the visit note. 

## 2015-07-20 NOTE — Patient Instructions (Signed)

## 2015-07-21 LAB — RPR

## 2015-07-21 NOTE — Progress Notes (Signed)
Subjective:  Patient ID: Nicholas Buck, male    DOB: 1941-03-02  Age: 74 y.o. MRN: [id number]  CC: Hypertension   HPI Nicholas Buck presents for follow-up on his blood pressure. He complains that the numbness and tingling in his hands and feet has not improved much over the last few years even though he has been consistently treated for B12 deficiency. 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 He denies any cough, chest pain, hemoptysis.  Outpatient Prescriptions Prior to Visit  Medication Sig Dispense Refill  . atorvastatin (LIPITOR) 20 MG tablet TAKE 1 TABLET BY MOUTH EVERY DAY 90 tablet 3  . ferrous sulfate 325 (65 FE) MG EC tablet Take 1 tablet (325 mg total) by mouth 3 (three) times daily with meals. 90 tablet 2  . fluticasone (FLONASE) 50 MCG/ACT nasal spray Place 2 sprays into both nostrils daily. 16 g 11  . gabapentin (NEURONTIN) 800 MG tablet Take 2 tablets (1,600 mg total) by mouth 2 (two) times daily. 360 tablet 3  . tizanidine (ZANAFLEX) 2 MG capsule Take 1 capsule (2 mg total) by mouth 3 (three) times daily as needed for muscle spasms. 90 capsule 2  . traZODone (DESYREL) 100 MG tablet Take 1 tablet (100 mg total) by mouth at bedtime. 90 tablet 3  . methylPREDNISolone (MEDROL, PAK,) 4 MG tablet follow package directions 21 tablet 0   No facility-administered medications prior to visit.    ROS Review of Systems  Constitutional: Negative.  Negative for fever, chills, diaphoresis, appetite change and fatigue.  HENT: Negative.  Negative for trouble swallowing and voice change.   Eyes: Negative.   Respiratory: Positive for shortness of breath. Negative for cough, choking, chest tightness, wheezing and stridor.   Cardiovascular: Negative.  Negative for chest pain, palpitations and leg swelling.  Gastrointestinal: Negative.  Negative for nausea, vomiting, abdominal pain, diarrhea, constipation and blood in stool.    Endocrine: Negative.   Genitourinary: Negative.   Musculoskeletal: Negative.  Negative for back pain, joint swelling, neck pain and neck stiffness.  Skin: Negative.   Allergic/Immunologic: Negative.   Neurological: Positive for numbness. Negative for dizziness, tremors and weakness.  Hematological: Negative.  Negative for adenopathy. Does not bruise/bleed easily.  Psychiatric/Behavioral: Negative.     Objective:  BP 150/78 mmHg  Pulse 73  Temp(Src) 97.6 F (36.4 C) (Oral)  Resp 16  Ht '6\' 1"'$  (1.854 m)  Wt 195 lb (88.451 kg)  BMI 25.73 kg/m2  SpO2 96%  BP Readings from Last 3 Encounters:  07/20/15 150/78  02/05/15 120/82  07/28/14 126/80    Wt Readings from Last 3 Encounters:  07/20/15 195 lb (88.451 kg)  02/05/15 196 lb (88.905 kg)  07/28/14 197 lb (89.359 kg)    Physical Exam  Constitutional: He appears well-developed and well-nourished. No distress.  HENT:  Head: Normocephalic and atraumatic.  Mouth/Throat: Oropharynx is clear and moist. No oropharyngeal exudate.  Eyes: Conjunctivae are normal. Right eye exhibits no discharge. Left eye exhibits no discharge. No scleral icterus.  Neck: Normal range of motion. Neck supple. No JVD present. No tracheal deviation present. No thyromegaly present.  Cardiovascular: Normal rate, regular rhythm, normal heart sounds and intact distal pulses.  Exam reveals no gallop and no friction rub.   No murmur heard. Pulmonary/Chest: Effort normal and breath sounds normal. No stridor. No respiratory distress. He has no wheezes. He has no rales. He exhibits no tenderness.  Abdominal:  Soft. Bowel sounds are normal. He exhibits no distension and no mass. There is no tenderness. There is no rebound and no guarding.  Musculoskeletal: Normal range of motion. He exhibits no edema or tenderness.  Lymphadenopathy:    He has no cervical adenopathy.  Neurological: He is alert. He has normal strength. He displays no atrophy, no tremor and normal  reflexes. No cranial nerve deficit or sensory deficit. He exhibits normal muscle tone. He displays a negative Romberg sign. He displays no seizure activity. Coordination and gait normal. He displays no Babinski's sign on the right side. He displays no Babinski's sign on the left side.  Reflex Scores:      Tricep reflexes are 1+ on the right side and 1+ on the left side.      Bicep reflexes are 1+ on the right side and 1+ on the left side.      Brachioradialis reflexes are 1+ on the right side and 1+ on the left side.      Patellar reflexes are 1+ on the right side and 1+ on the left side.      Achilles reflexes are 1+ on the right side and 1+ on the left side. He has a slightly reduced vibratory sense in bilateral upper and lower extremities but the remainder of new his neuro exam is within normal limits  Skin: Skin is warm and dry. No rash noted. He is not diaphoretic. No erythema. No pallor.  Psychiatric: He has a normal mood and affect. His behavior is normal. Judgment and thought content normal.  Vitals reviewed.   Lab Results  Component Value Date   WBC 5.9 07/20/2015   HGB 15.1 07/20/2015   HCT 44.0 07/20/2015   PLT 154.0 07/20/2015   GLUCOSE 84 07/20/2015   CHOL 165 07/20/2015   TRIG 177.0* 07/20/2015   HDL 34.10* 07/20/2015   LDLDIRECT 103.9 07/28/2014   LDLCALC 96 07/20/2015   ALT 30 07/28/2014   AST 26 07/28/2014   NA 137 07/20/2015   K 4.4 07/20/2015   CL 101 07/20/2015   CREATININE 0.89 07/20/2015   BUN 13 07/20/2015   CO2 29 07/20/2015   TSH 1.72 07/20/2015   HGBA1C 5.6 07/20/2015    Dg Cervical Spine Complete  01/14/2014   CLINICAL DATA:  Neck pain.  EXAM: CERVICAL SPINE  4+ VIEWS  COMPARISON:  None.  FINDINGS: Diffuse degenerative changes noted of the cervical spine. There is particularly prominent disc space loss and endplate osteophyte formation C5-C6. 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 Multilevel bilateral neural foraminal narrowing is present. No acute  abnormalities identified. No evidence of fracture or dislocation. Bilateral carotid atherosclerotic vascular disease. Pulmonary apices are clear.  IMPRESSION: 1. 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 Multilevel bilateral prominent neural foraminal narrowing is present. MRI can be obtained for further evaluation as needed. 2. Congenital fusion of C6 - C7.   Electronically Signed   By: Marcello Moores  Register   On: 01/14/2014 15:10    Assessment & Plan:   Cynthia was seen today for hypertension.  Diagnoses and all orders for this visit:  Hereditary and idiopathic peripheral neuropathy- I have ordered labs today to screen him for other types of neuropathy like diabetes, lymphoproliferative disease, syphilis, Wilson's disease and if also ordered a nerve conduction and EMG of his upper and lower extremities to better identify what the cause of his neuropathy is. -     CBC with Differential/Platelet; Future -     Hemoglobin A1c; Future -  SPEP & IFE with QIG; Future -     RPR; Future -     Ceruloplasmin; Future -     Ambulatory referral to Neurology  Microcytic anemia- this has resolved -     CBC with Differential/Platelet; Future  Hyperglycemia- he has prediabetes and will work on his lifestyle modifications. -     Hemoglobin A1c; Future  Hyperlipidemia with target LDL less than 130 -     Lipid panel; Future -     TSH; Future  Essential hypertension- his blood pressure is adequately well controlled, his electrolytes and renal function are stable. -     Basic metabolic panel; Future -     Urinalysis, Routine w reflex microscopic (not at [facility]); Future -     Azilsartan Medoxomil (EDARBI) 40 MG TABS; Take 1 tablet by mouth daily.  Vitamin B12 deficiency neuropathy- I will continue monthly vitamin B12 injections. -     cyanocobalamin ((VITAMIN B-12)) injection 1,000 mcg; Inject 1 mL (1,000 mcg total) into the muscle once.  Need for 23-polyvalent pneumococcal polysaccharide vaccine -      Pneumococcal polysaccharide vaccine 23-valent greater than or equal to 2yo subcutaneous/IM   I have discontinued Mr. Saetern methylPREDNISolone. I am also having him start on Azilsartan Medoxomil. Additionally, I am having him maintain his ferrous sulfate, fluticasone, traZODone, tizanidine, gabapentin, and atorvastatin. We administered cyanocobalamin.  Meds ordered this encounter  Medications  . Azilsartan Medoxomil (EDARBI) 40 MG TABS    Sig: Take 1 tablet by mouth daily.    Dispense:  35 tablet    Refill:  0  . cyanocobalamin ((VITAMIN B-12)) injection 1,000 mcg    Sig:      Follow-up: Return in about 4 weeks (around 08/17/2015).  Scarlette Calico, MD

## 2015-07-22 LAB — SPEP & IFE WITH QIG
ALBUMIN ELP: 4.7 g/dL (ref 3.8–4.8)
Alpha-1-Globulin: 0.3 g/dL (ref 0.2–0.3)
Alpha-2-Globulin: 0.8 g/dL (ref 0.5–0.9)
BETA 2: 0.3 g/dL (ref 0.2–0.5)
Beta Globulin: 0.4 g/dL (ref 0.4–0.6)
Gamma Globulin: 0.9 g/dL (ref 0.8–1.7)
IGA: 184 mg/dL (ref 68–379)
IGG (IMMUNOGLOBIN G), SERUM: 686 mg/dL (ref 650–1600)
IGM, SERUM: 305 mg/dL — AB (ref 41–251)
TOTAL PROTEIN, SERUM ELECTROPHOR: 7.4 g/dL (ref 6.1–8.1)

## 2015-07-22 LAB — CERULOPLASMIN: Ceruloplasmin: 27 mg/dL (ref 18–36)

## 2015-07-26 ENCOUNTER — Encounter: Payer: Self-pay | Admitting: Internal Medicine

## 2015-07-28 ENCOUNTER — Telehealth: Payer: Self-pay | Admitting: Internal Medicine

## 2015-07-28 NOTE — Telephone Encounter (Signed)
Pt called in and said that his bp meds are making him feel worse.  He said that he has had diarrhea every since he has been taking it.  Can he stop taking it til his appt?      Best number [phone]

## 2015-07-28 NOTE — Telephone Encounter (Signed)
Pt next OV is on 08/17/15. Please advise.

## 2015-07-28 NOTE — Telephone Encounter (Signed)
Pt informed will call back if no improvement.

## 2015-07-28 NOTE — Telephone Encounter (Signed)
yes

## 2015-08-17 ENCOUNTER — Encounter: Payer: Self-pay | Admitting: Internal Medicine

## 2015-08-17 ENCOUNTER — Ambulatory Visit (INDEPENDENT_AMBULATORY_CARE_PROVIDER_SITE_OTHER): Payer: Medicare Other | Admitting: Internal Medicine

## 2015-08-17 VITALS — BP 148/84 | HR 66 | Temp 97.9°F | Resp 16 | Ht 73.0 in | Wt 193.0 lb

## 2015-08-17 DIAGNOSIS — G609 Hereditary and idiopathic neuropathy, unspecified: Secondary | ICD-10-CM | POA: Diagnosis not present

## 2015-08-17 DIAGNOSIS — Z23 Encounter for immunization: Secondary | ICD-10-CM

## 2015-08-17 DIAGNOSIS — E538 Deficiency of other specified B group vitamins: Secondary | ICD-10-CM

## 2015-08-17 DIAGNOSIS — I10 Essential (primary) hypertension: Secondary | ICD-10-CM

## 2015-08-17 DIAGNOSIS — G63 Polyneuropathy in diseases classified elsewhere: Secondary | ICD-10-CM

## 2015-08-17 MED ORDER — CYANOCOBALAMIN 1000 MCG/ML IJ SOLN
1000.0000 ug | Freq: Once | INTRAMUSCULAR | Status: AC
  Administered 2015-08-17: 1000 ug via INTRAMUSCULAR

## 2015-08-17 MED ORDER — PREGABALIN 75 MG PO CAPS: 75.0000 mg | ORAL_CAPSULE | Freq: Three times a day (TID) | ORAL | Status: DC

## 2015-08-17 NOTE — Patient Instructions (Signed)

## 2015-08-17 NOTE — Progress Notes (Signed)
Subjective:  Patient ID: Nicholas Buck, male    DOB: 02-07-1941  Age: 74 y.o. MRN: [id number]  CC: Hypertension   HPI Nicholas Buck presents for a blood pressure check. He was recently diagnosed with hypertension and was placed on Edarbi but he said it caused diarrhea so he stopped taking it. He has been checking his blood pressure at the pharmacy since then and feels like it has been well controlled. He also complains of worsening neuropathy pain in his feet. He has numbness alternating with stabbing indistinctness. He states the gabapentin is not controlling the pain. Said the symptoms for many years and his had a complete workup as well.  Outpatient Prescriptions Prior to Visit  Medication Sig Dispense Refill  . atorvastatin (LIPITOR) 20 MG tablet TAKE 1 TABLET BY MOUTH EVERY DAY 90 tablet 3  . ferrous sulfate 325 (65 FE) MG EC tablet Take 1 tablet (325 mg total) by mouth 3 (three) times daily with meals. (Patient taking differently: Take 325 mg by mouth daily. ) 90 tablet 2  . traZODone (DESYREL) 100 MG tablet Take 1 tablet (100 mg total) by mouth at bedtime. 90 tablet 3  . fluticasone (FLONASE) 50 MCG/ACT nasal spray Place 2 sprays into both nostrils daily. 16 g 11  . gabapentin (NEURONTIN) 800 MG tablet Take 2 tablets (1,600 mg total) by mouth 2 (two) times daily. 360 tablet 3  . tizanidine (ZANAFLEX) 2 MG capsule Take 1 capsule (2 mg total) by mouth 3 (three) times daily as needed for muscle spasms. (Patient not taking: Reported on 08/17/2015) 90 capsule 2  . Azilsartan Medoxomil (EDARBI) 40 MG TABS Take 1 tablet by mouth daily. (Patient not taking: Reported on 08/17/2015) 35 tablet 0   No facility-administered medications prior to visit.    ROS Review of Systems  Constitutional: Negative.  Negative for fever, chills, diaphoresis, appetite change and fatigue.  HENT: Negative.   Eyes: Negative.   Respiratory: Negative.  Negative for cough, choking, chest tightness, shortness of  breath and stridor.   Cardiovascular: Negative.  Negative for chest pain, palpitations and leg swelling.  Gastrointestinal: Negative.  Negative for nausea, vomiting, abdominal pain, diarrhea and blood in stool.  Endocrine: Negative.   Genitourinary: Negative.   Musculoskeletal: Negative.  Negative for myalgias, back pain, joint swelling and arthralgias.  Skin: Negative.  Negative for rash.  Allergic/Immunologic: Negative.   Neurological: Positive for numbness. Negative for dizziness, tremors, syncope, speech difficulty, weakness and light-headedness.  Hematological: Negative.  Negative for adenopathy. Does not bruise/bleed easily.  Psychiatric/Behavioral: Negative.     Objective:  BP 148/84 mmHg  Pulse 66  Temp(Src) 97.9 F (36.6 C) (Oral)  Resp 16  Ht '6\' 1"'$  (1.854 m)  Wt 193 lb (87.544 kg)  BMI 25.47 kg/m2  SpO2 97%  BP Readings from Last 3 Encounters:  08/17/15 148/84  07/20/15 150/78  02/05/15 120/82    Wt Readings from Last 3 Encounters:  08/17/15 193 lb (87.544 kg)  07/20/15 195 lb (88.451 kg)  02/05/15 196 lb (88.905 kg)    Physical Exam  Constitutional: No distress.  HENT:  Mouth/Throat: Oropharynx is clear and moist. No oropharyngeal exudate.  Eyes: Conjunctivae are normal. Right eye exhibits no discharge. Left eye exhibits no discharge. No scleral icterus.  Neck: Normal range of motion. Neck supple. No JVD present. No tracheal deviation present. No thyromegaly present.  Cardiovascular: Normal rate, regular rhythm, normal heart sounds and intact distal pulses.  Exam reveals no gallop and no  friction rub.   No murmur heard. Pulmonary/Chest: Effort normal and breath sounds normal. No stridor. No respiratory distress. He has no wheezes. He has no rales. He exhibits no tenderness.  Abdominal: Soft. Bowel sounds are normal. He exhibits no distension and no mass. There is no tenderness. There is no rebound and no guarding.  Musculoskeletal: Normal range of motion. He  exhibits no edema or tenderness.  Lymphadenopathy:    He has no cervical adenopathy.  Neurological: He has normal strength. He displays no atrophy, no tremor and normal reflexes. No cranial nerve deficit or sensory deficit. He exhibits normal muscle tone. He displays a negative Romberg sign. He displays no seizure activity. Coordination and gait normal.  Reflex Scores:      Tricep reflexes are 1+ on the right side and 1+ on the left side.      Bicep reflexes are 1+ on the right side and 1+ on the left side.      Brachioradialis reflexes are 1+ on the right side and 1+ on the left side.      Patellar reflexes are 1+ on the right side and 1+ on the left side.      Achilles reflexes are 1+ on the right side and 1+ on the left side. Skin: Skin is warm and dry. No rash noted. He is not diaphoretic. No erythema. No pallor.  Vitals reviewed.   Lab Results  Component Value Date   WBC 5.9 07/20/2015   HGB 15.1 07/20/2015   HCT 44.0 07/20/2015   PLT 154.0 07/20/2015   GLUCOSE 84 07/20/2015   CHOL 165 07/20/2015   TRIG 177.0* 07/20/2015   HDL 34.10* 07/20/2015   LDLDIRECT 103.9 07/28/2014   LDLCALC 96 07/20/2015   ALT 30 07/28/2014   AST 26 07/28/2014   NA 137 07/20/2015   K 4.4 07/20/2015   CL 101 07/20/2015   CREATININE 0.89 07/20/2015   BUN 13 07/20/2015   CO2 29 07/20/2015   TSH 1.72 07/20/2015   HGBA1C 5.6 07/20/2015    Dg Cervical Spine Complete  01/14/2014   CLINICAL DATA:  Neck pain.  EXAM: CERVICAL SPINE  4+ VIEWS  COMPARISON:  None.  FINDINGS: Diffuse degenerative changes noted of the cervical spine. There is particularly prominent disc space loss and endplate osteophyte formation C5-C6. 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 Multilevel bilateral neural foraminal narrowing is present. No acute abnormalities identified. No evidence of fracture or dislocation. Bilateral carotid atherosclerotic vascular disease. Pulmonary apices are clear.  IMPRESSION: 1. Severe degenerative changes  of the cervical spine. Multilevel bilateral prominent neural foraminal narrowing is present. MRI can be obtained for further evaluation as needed. 2. Congenital fusion of C6 - C7.   Electronically Signed   By: Marcello Moores  Register   On: 01/14/2014 15:10    Assessment & Plan:   Timouthy was seen today for hypertension.  Diagnoses and all orders for this visit:  Hereditary and idiopathic peripheral neuropathy- his pain is not adequately controlled with gabapentin so I have asked him to try Lyrica -     pregabalin (LYRICA) 75 MG capsule; Take 1 capsule (75 mg total) by mouth 3 (three) times daily.  Vitamin B12 deficiency neuropathy- will continue B12 injections -     cyanocobalamin ((VITAMIN B-12)) injection 1,000 mcg; Inject 1 mL (1,000 mcg total) into the muscle once. -     pregabalin (LYRICA) 75 MG capsule; Take 1 capsule (75 mg total) by mouth 3 (three) times daily.  Need for influenza  vaccination -     Flu Vaccine QUAD 36+ mos IM  Essential hypertension- his blood pressure is only moderately elevated, he will start taking Edarbi to the diarrhea, he will continue to monitor his blood pressure at home, he will work on his lifestyle modifications, I will recheck his blood pressure here in about 4-6 months.   I have discontinued Mr. Bordonaro fluticasone, gabapentin, and Azilsartan Medoxomil. I am also having him start on pregabalin. Additionally, I am having him maintain his ferrous sulfate, traZODone, tizanidine, and atorvastatin. We administered cyanocobalamin.  Meds ordered this encounter  Medications  . cyanocobalamin ((VITAMIN B-12)) injection 1,000 mcg    Sig:   . pregabalin (LYRICA) 75 MG capsule    Sig: Take 1 capsule (75 mg total) by mouth 3 (three) times daily.    Dispense:  90 capsule    Refill:  5     Follow-up: Return in about 4 months (around 12/17/2015).  Scarlette Calico, MD

## 2015-08-18 ENCOUNTER — Other Ambulatory Visit: Payer: Self-pay | Admitting: *Deleted

## 2015-08-18 DIAGNOSIS — G609 Hereditary and idiopathic neuropathy, unspecified: Secondary | ICD-10-CM

## 2015-08-24 ENCOUNTER — Ambulatory Visit (INDEPENDENT_AMBULATORY_CARE_PROVIDER_SITE_OTHER): Payer: Medicare Other | Admitting: Neurology

## 2015-08-24 ENCOUNTER — Encounter: Payer: Medicare Other | Admitting: Neurology

## 2015-08-24 DIAGNOSIS — G609 Hereditary and idiopathic neuropathy, unspecified: Secondary | ICD-10-CM | POA: Diagnosis not present

## 2015-08-24 NOTE — Procedures (Signed)
[facility]  [place], [place]  [place], [place] [address] Tel: [phone] Fax:  [phone] Test Date:  08/24/2015  Patient: Nicholas Buck DOB: 1941/09/03 Physician: Narda Amber  Sex: Male Height: '6\' 2"'$  Ref Phys: Scarlette Calico, M.D.  ID#: [id number] Temp: 33.0C Technician: Jerilynn Mages. Dean   Patient Complaints: This is a 74 year old gentleman presenting for evaluation of bilateral feet paresthesias and low back pain.   NCV & EMG Findings: Extensive electrodiagnostic testing of the right lower extremity and additional studies of the left shows:  1. Bilateral sural and superficial peroneal sensory responses are absent. 2. Bilateral tibial and the right peroneal (EDB) motor responses are reduced. Right peroneal motor response recording at the tibialis anterior and the left peroneal (EDB) motor responses are within normal limits. 3. Bilateral H reflex studies are prolonged. 4. Chronic motor axon loss changes are seen involving the muscles below the knee and conform to a gradient pattern. There are also very sparse chronic motor axon loss changes affecting bilateral gluteus medius muscles. There is no evidence of any active denervation.  Impression: 1. 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 Overall, these findings are moderate to severe in degree electrically. 2. There may also be a superimposed chronic L5 radiculopathy, very mild in degree electrically. Clinical correlation recommended.   ___________________________ Narda Amber, DO    Nerve Conduction Studies Anti Sensory Summary Table   Stim Site NR Peak (ms) Norm Peak (ms) P-T Amp (V) Norm P-T Amp  Left Sup Peroneal Anti Sensory (Ant Lat Mall)  12 cm NR  <4.6  >3  Right Sup Peroneal Anti Sensory (Ant Lat Mall)  12 cm NR  <4.6  >3  Left Sural Anti Sensory (Lat Mall)  Calf NR  <4.6  >3  Right Sural Anti  Sensory (Lat Mall)  Calf NR  <4.6  >3   Motor Summary Table   Stim Site NR Onset (ms) Norm Onset (ms) O-P Amp (mV) Norm O-P Amp Site1 Site2 Delta-0 (ms) Dist (cm) Vel (m/s) Norm Vel (m/s)  Left Peroneal Motor (Ext Dig Brev)  Ankle    4.7 <6.0 2.5 >2.5 B Fib Ankle 9.1 37.0 41 >40  B Fib    13.8  2.3  Poplt B Fib 2.1 10.0 48 >40  Poplt    15.9  2.3         Right Peroneal Motor (Ext Dig Brev)  Ankle    5.5 <6.0 2.1 >2.5 B Fib Ankle 8.8 36.0 41 >40  B Fib    14.3  1.6  Poplt B Fib 3.1 13.0 42 >40  Poplt    17.4  1.6         Right Peroneal TA Motor (Tib Ant)  Fib Head    3.1 <4.5 6.0 >3 Poplit Fib Head 2.4 10.0 42 >40  Poplit    5.5  5.6         Left Tibial Motor (Abd Hall Brev)    Multiple attempts at pop fossa  Ankle    4.4 <6.0 2.1 >4 Knee Ankle 10.4 43.0 41 >40  Knee    14.8  0.3         Right Tibial Motor (Abd Hall Brev)  Ankle    5.1 <6.0 2.1 >4 Knee Ankle 11.2 44.5 40 >40  Knee    16.3  1.6          F Wave Studies  NR F-Lat (ms) Lat Norm (ms) L-R F-Lat (ms)  Left Tibial (Mrkrs) (Abd Hallucis)     58.98 <55    H Reflex Studies   NR H-Lat (ms) Lat Norm (ms) L-R H-Lat (ms)  Left Tibial (Gastroc)     36.46 <35 0.00  Right Tibial (Gastroc)     36.46 <35 0.00   EMG   Side Muscle Ins Act Fibs Psw Fasc Number Recrt Dur Dur. Amp Amp. Poly Poly. Comment  Left Gastroc Nml Nml Nml Nml 2- Mod-R Some 1+ Some 1+ Some 1+ N/A  Left GluteusMed Nml Nml Nml Nml 1- Mod-V Few 1+ Nml Nml Nml Nml N/A  Left RectFemoris Nml Nml Nml Nml Nml Nml Nml Nml Nml Nml Nml Nml N/A  Left BicepsFemS Nml Nml Nml Nml Nml Nml Nml Nml Nml Nml Nml Nml N/A  Left AntTibialis Nml Nml Nml Nml 1- Rapid Some 1+ Some 1+ Some 1+ N/A  Right Flex Dig Long Nml Nml Nml Nml 1- Mod-R Some 1+ Some 1+ Nml Nml N/A  Right GluteusMed Nml Nml Nml Nml 1- Mod-V Few 1+ Nml Nml Nml Nml N/A  Right BicepsFemS Nml Nml Nml Nml Nml Nml Nml Nml Nml Nml Nml Nml N/A  Right RectFemoris Nml Nml Nml Nml Nml Nml Nml Nml Nml Nml Nml Nml N/A    Right Gastroc Nml Nml Nml Nml Nml Nml Nml Nml Nml Nml Nml Nml N/A  Right AntTibialis Nml Nml Nml Nml 1- Rapid Some 1+ Some 1+ Some 1+ N/A      Waveforms:

## 2015-09-03 ENCOUNTER — Telehealth: Payer: Self-pay | Admitting: Internal Medicine

## 2015-09-03 NOTE — Telephone Encounter (Signed)
Pt was taken off gabapentin and put on pregabalin (LYRICA) 75 MG capsule [[id number]. He can't afford Lyrica and he only has 1 pill left.  He's not sure what needs to be done and would like to talk with someone about this.  Please call patient

## 2015-09-05 ENCOUNTER — Encounter: Payer: Self-pay | Admitting: Internal Medicine

## 2015-09-06 ENCOUNTER — Other Ambulatory Visit: Payer: Self-pay | Admitting: Internal Medicine

## 2015-09-06 DIAGNOSIS — G63 Polyneuropathy in diseases classified elsewhere: Principal | ICD-10-CM

## 2015-09-06 DIAGNOSIS — E538 Deficiency of other specified B group vitamins: Secondary | ICD-10-CM

## 2015-09-06 DIAGNOSIS — G609 Hereditary and idiopathic neuropathy, unspecified: Secondary | ICD-10-CM

## 2015-09-06 MED ORDER — PREGABALIN 50 MG PO CAPS: 100.0000 mg | ORAL_CAPSULE | Freq: Three times a day (TID) | ORAL | Status: DC

## 2015-09-06 NOTE — Telephone Encounter (Signed)
I have samples of the 50 mg strength He can take 1-2 TID for the pain I will leave the samples up front for him

## 2015-09-06 NOTE — Telephone Encounter (Signed)
Pt on schedule for 10/19 to discuss. He states he put himself back in gabapentin from leftover pills since he ran out of lyrica. Informed of samples at the front, pt still states he does not want to be on it at all even with samples due to cost.

## 2015-09-06 NOTE — Telephone Encounter (Signed)
Alternative. Please advise

## 2015-09-07 ENCOUNTER — Emergency Department (HOSPITAL_COMMUNITY): Payer: Medicare Other

## 2015-09-07 ENCOUNTER — Encounter (HOSPITAL_COMMUNITY): Payer: Self-pay | Admitting: Emergency Medicine

## 2015-09-07 ENCOUNTER — Inpatient Hospital Stay (HOSPITAL_COMMUNITY)
Admission: EM | Admit: 2015-09-07 | Discharge: 2015-09-09 | DRG: 066 | Disposition: A | Discharge status: Home / Self Care | Payer: Medicare Other | Attending: Family Medicine | Admitting: Family Medicine

## 2015-09-07 DIAGNOSIS — S0990XA Unspecified injury of head, initial encounter: Secondary | ICD-10-CM | POA: Diagnosis not present

## 2015-09-07 DIAGNOSIS — I10 Essential (primary) hypertension: Secondary | ICD-10-CM | POA: Diagnosis present

## 2015-09-07 DIAGNOSIS — R252 Cramp and spasm: Secondary | ICD-10-CM | POA: Diagnosis not present

## 2015-09-07 DIAGNOSIS — R739 Hyperglycemia, unspecified: Secondary | ICD-10-CM | POA: Diagnosis not present

## 2015-09-07 DIAGNOSIS — R29810 Facial weakness: Secondary | ICD-10-CM | POA: Diagnosis not present

## 2015-09-07 DIAGNOSIS — I6319 Cerebral infarction due to embolism of other precerebral artery: Principal | ICD-10-CM | POA: Diagnosis present

## 2015-09-07 DIAGNOSIS — D509 Iron deficiency anemia, unspecified: Secondary | ICD-10-CM | POA: Diagnosis not present

## 2015-09-07 DIAGNOSIS — J449 Chronic obstructive pulmonary disease, unspecified: Secondary | ICD-10-CM | POA: Diagnosis not present

## 2015-09-07 DIAGNOSIS — R471 Dysarthria and anarthria: Secondary | ICD-10-CM | POA: Diagnosis not present

## 2015-09-07 DIAGNOSIS — E785 Hyperlipidemia, unspecified: Secondary | ICD-10-CM | POA: Insufficient documentation

## 2015-09-07 DIAGNOSIS — R4781 Slurred speech: Secondary | ICD-10-CM | POA: Diagnosis not present

## 2015-09-07 DIAGNOSIS — R260 Ataxic gait: Secondary | ICD-10-CM | POA: Diagnosis present

## 2015-09-07 DIAGNOSIS — I6789 Other cerebrovascular disease: Secondary | ICD-10-CM | POA: Diagnosis not present

## 2015-09-07 DIAGNOSIS — R29702 NIHSS score 2: Secondary | ICD-10-CM | POA: Diagnosis present

## 2015-09-07 DIAGNOSIS — Z87891 Personal history of nicotine dependence: Secondary | ICD-10-CM | POA: Diagnosis not present

## 2015-09-07 DIAGNOSIS — I639 Cerebral infarction, unspecified: Secondary | ICD-10-CM | POA: Diagnosis present

## 2015-09-07 DIAGNOSIS — I6350 Cerebral infarction due to unspecified occlusion or stenosis of unspecified cerebral artery: Secondary | ICD-10-CM | POA: Diagnosis not present

## 2015-09-07 LAB — I-STAT CHEM 8, ED
BUN: 10 mg/dL (ref 6–20)
CHLORIDE: 100 mmol/L — AB (ref 101–111)
Calcium, Ion: 1.15 mmol/L (ref 1.13–1.30)
Creatinine, Ser: 0.9 mg/dL (ref 0.61–1.24)
GLUCOSE: 104 mg/dL — AB (ref 65–99)
HEMATOCRIT: 44 % (ref 39.0–52.0)
HEMOGLOBIN: 15 g/dL (ref 13.0–17.0)
POTASSIUM: 3.9 mmol/L (ref 3.5–5.1)
SODIUM: 139 mmol/L (ref 135–145)
TCO2: 23 mmol/L (ref 0–100)

## 2015-09-07 LAB — CBC
HCT: 40.6 % (ref 39.0–52.0)
HEMOGLOBIN: 14.2 g/dL (ref 13.0–17.0)
MCH: 32.1 pg (ref 26.0–34.0)
MCHC: 35 g/dL (ref 30.0–36.0)
MCV: 91.9 fL (ref 78.0–100.0)
PLATELETS: 137 10*3/uL — AB (ref 150–400)
RBC: 4.42 MIL/uL (ref 4.22–5.81)
RDW: 12.6 % (ref 11.5–15.5)
WBC: 5.8 10*3/uL (ref 4.0–10.5)

## 2015-09-07 LAB — PROTIME-INR
INR: 1.06 (ref 0.00–1.49)
Prothrombin Time: 14 seconds (ref 11.6–15.2)

## 2015-09-07 LAB — COMPREHENSIVE METABOLIC PANEL
ALBUMIN: 4.7 g/dL (ref 3.5–5.0)
ALT: 24 U/L (ref 17–63)
AST: 28 U/L (ref 15–41)
Alkaline Phosphatase: 83 U/L (ref 38–126)
Anion gap: 7 (ref 5–15)
BILIRUBIN TOTAL: 1.2 mg/dL (ref 0.3–1.2)
BUN: 11 mg/dL (ref 6–20)
CO2: 25 mmol/L (ref 22–32)
Calcium: 9.5 mg/dL (ref 8.9–10.3)
Chloride: 104 mmol/L (ref 101–111)
Creatinine, Ser: 0.81 mg/dL (ref 0.61–1.24)
GFR calc Af Amer: >60 mL/min (ref >60)
GFR calc non Af Amer: >60 mL/min (ref >60)
GLUCOSE: 107 mg/dL — AB (ref 65–99)
POTASSIUM: 3.9 mmol/L (ref 3.5–5.1)
SODIUM: 136 mmol/L (ref 135–145)
TOTAL PROTEIN: 7.5 g/dL (ref 6.5–8.1)

## 2015-09-07 LAB — APTT: APTT: 33 s (ref 24–37)

## 2015-09-07 LAB — CBG MONITORING, ED: Glucose-Capillary: 108 mg/dL — ABNORMAL HIGH (ref 65–99)

## 2015-09-07 LAB — DIFFERENTIAL
BASOS ABS: 0.1 10*3/uL (ref 0.0–0.1)
Basophils Relative: 1 %
EOS ABS: 0.2 10*3/uL (ref 0.0–0.7)
Eosinophils Relative: 3 %
LYMPHS ABS: 1.7 10*3/uL (ref 0.7–4.0)
LYMPHS PCT: 30 %
Monocytes Absolute: 0.5 10*3/uL (ref 0.1–1.0)
Monocytes Relative: 9 %
NEUTROS PCT: 57 %
Neutro Abs: 3.4 10*3/uL (ref 1.7–7.7)

## 2015-09-07 LAB — I-STAT TROPONIN, ED: Troponin i, poc: 0 ng/mL (ref 0.00–0.08)

## 2015-09-07 MED ORDER — ASPIRIN 81 MG PO CHEW
81.0000 mg | CHEWABLE_TABLET | Freq: Once | ORAL | Status: AC
  Administered 2015-09-07: 81 mg via ORAL
  Filled 2015-09-07: qty 1

## 2015-09-07 NOTE — ED Notes (Signed)
Patient transported to MRI 

## 2015-09-07 NOTE — H&P (Signed)
[facility] History and Physical  Nicholas Buck QIH:[id number] DOB: [birth year] DOA: 09/07/2015  Referring physician: Jola Schmidt MD PCP: Scarlette Calico, MD   Chief Complaint: Acute Stroke  HPI: Nicholas Buck is a 74 y.o. male with history of COPD HTN HLD presents with an acute stroke. Patient states taht he developed some light headedness this morning. This was followed by difficulty speaking. He states that he also felt incoordinated. He also noted slurred speech. He states that he did not fall. He did not lose consciousness or have syncope. He states that when he felt dizzy he sat down. This seemed to help. He states that he had not been drinking much fluids.   Review of Systems:  Constitutional:  No weight loss, night sweats, Fevers, chills, fatigue.  HEENT:  Slight headaches, nasal congestion, post nasal drip,  Cardio-vascular:  No chest pain, Orthopnea, PND, +dizziness, palpitations  GI:  No heartburn, indigestion, abdominal pain, nausea, vomiting  Resp:  No shortness of breath with exertion or at rest. No coughing up of blood  Skin:  no rash or lesions.  GU:  no dysuria, change in color of urine, no urgency or frequency.  Musculoskeletal:  No joint pain or swelling. No decreased range of motion.  Psych:  No change in mood or affect. No depression or anxiety.   Past Medical History  Diagnosis Date  . Anemia     microcytic  . COPD (chronic obstructive pulmonary disease) ([place])     ?  Marland Kitchen Hyperlipidemia   . GERD (gastroesophageal reflux disease)    Past Surgical History  Procedure Laterality Date  . Knee surgery      LEFT  . Neck surgery      DISC  . Colonoscopy    . Polypectomy     Social History:  reports that he quit smoking about 3 years ago. His smoking use included Cigarettes. He has a 50 pack-year smoking history. He has never used smokeless tobacco. He reports that he does not drink alcohol or use illicit drugs.  Allergies  Allergen Reactions  .  Mevacor [Lovastatin]     Muscle aches    Family History  Problem Relation Age of Onset  . Heart disease Father   . Colon cancer Neg Hx   . Esophageal cancer Neg Hx   . Rectal cancer Neg Hx   . Stomach cancer Neg Hx   . Cancer Neg Hx   . COPD Neg Hx   . Diabetes Neg Hx   . Drug abuse Neg Hx   . Early death Neg Hx   . Hearing loss Neg Hx   . Alcohol abuse Neg Hx   . Asthma Neg Hx   . Stroke Neg Hx   . Hyperlipidemia Neg Hx   . Kidney disease Neg Hx   . Hypertension Neg Hx      Prior to Admission medications   Medication Sig Start Date End Date Taking? Authorizing Provider  atorvastatin (LIPITOR) 20 MG tablet TAKE 1 TABLET BY MOUTH EVERY DAY 06/28/15  Yes Janith Lima, MD  ferrous sulfate 325 (65 FE) MG EC tablet Take 1 tablet (325 mg total) by mouth 3 (three) times daily with meals. Patient taking differently: Take 325 mg by mouth daily.  02/05/13  Yes Irene Shipper, MD  gabapentin (NEURONTIN) 800 MG tablet Take 800 mg by mouth 2 (two) times daily.   Yes Historical Provider, MD  pregabalin (LYRICA) 50 MG capsule Take 2 capsules (100 mg  total) by mouth 3 (three) times daily. 09/06/15  Yes Janith Lima, MD  traZODone (DESYREL) 100 MG tablet Take 1 tablet (100 mg total) by mouth at bedtime. 02/05/15  Yes Janith Lima, MD  tizanidine (ZANAFLEX) 2 MG capsule Take 1 capsule (2 mg total) by mouth 3 (three) times daily as needed for muscle spasms. Patient not taking: Reported on 08/17/2015 02/05/15   Janith Lima, MD   Physical Exam: Filed Vitals:   09/07/15 1930 09/07/15 1955 09/07/15 2030 09/07/15 2214  BP: 179/91  163/93 176/94  Pulse: 68  61 67  Temp:  98.1 F (36.7 C)    TempSrc:      Resp: '14  10 14  '$ SpO2: 94%  95% 95%    Wt Readings from Last 3 Encounters:  09/07/15 87.544 kg (193 lb)  08/17/15 87.544 kg (193 lb)  07/20/15 88.451 kg (195 lb)    General:  Appears calm and comfortable Eyes: PERRL, normal lids, irises & conjunctiva ENT: grossly normal hearing, lips  & tongue Neck: no LAD, masses or thyromegaly Cardiovascular: RRR, no m/r/g. No LE edema. Telemetry: SR, no arrhythmias  Respiratory: CTA bilaterally, no w/r Abdomen: soft, ntnd Skin: no rash or induration seen on limited exam Musculoskeletal: grossly normal tone BUE/BLE Psychiatric: grossly normal mood and affect Neurologic: grossly non-focal gait not checked          Labs on Admission:  Basic Metabolic Panel:  Recent Labs Lab 09/07/15 1940 09/07/15 1951  NA 136 139  K 3.9 3.9  CL 104 100*  CO2 25  --   GLUCOSE 107* 104*  BUN 11 10  CREATININE 0.81 0.90  CALCIUM 9.5  --    Liver Function Tests:  Recent Labs Lab 09/07/15 1940  AST 28  ALT 24  ALKPHOS 83  BILITOT 1.2  PROT 7.5  ALBUMIN 4.7   No results for input(s): LIPASE, AMYLASE in the last 168 hours. No results for input(s): AMMONIA in the last 168 hours. CBC:  Recent Labs Lab 09/07/15 1940 09/07/15 1951  WBC 5.8  --   NEUTROABS 3.4  --   HGB 14.2 15.0  HCT 40.6 44.0  MCV 91.9  --   PLT 137*  --    Cardiac Enzymes: No results for input(s): CKTOTAL, CKMB, CKMBINDEX, TROPONINI in the last 168 hours.  BNP (last 3 results) No results for input(s): BNP in the last 8760 hours.  ProBNP (last 3 results) No results for input(s): PROBNP in the last 8760 hours.  CBG:  Recent Labs Lab 09/07/15 1918  GLUCAP 108*    Radiological Exams on Admission: Mr Brain Wo Contrast  09/07/2015   CLINICAL DATA:  Dysarthria and left arm clumsiness beginning earlier today. Left facial droop.  EXAM: MRI HEAD WITHOUT CONTRAST  TECHNIQUE: Multiplanar, multiecho pulse sequences of the brain and surrounding structures were obtained without intravenous contrast.  COMPARISON:  None.  FINDINGS: There is a small acute infarct in the right pons. There is no evidence of intracranial hemorrhage, mass, midline shift, or extra-axial fluid collection. Foci of T2 hyperintensity in the subcortical and deep cerebral white matter  bilaterally are nonspecific but compatible with mild-to-moderate chronic small vessel ischemic disease. A chronic lacunar infarct versus dilated perivascular space is noted in the right lentiform nucleus. There is mild generalized cerebral atrophy.  Prior bilateral cataract extraction is noted. Paranasal sinuses and mastoid air cells are clear. Major intracranial vascular flow voids are preserved, with prominent posterior communicating arteries and hypoplastic P1  segments bilaterally.  IMPRESSION: 1. Small acute right pontine infarct. 2. Mild-to-moderate chronic small vessel ischemic disease.   Electronically Signed   By: Logan Bores M.D.   On: 09/07/2015 21:42      Assessment/Plan Principal Problem:   Right pontine stroke [facility]) Active Problems:   COPD GOLD I   Hyperglycemia   Essential hypertension   1. Right Pontine Stroke -patient will be admitted to [facility] -will check A1C -MRI MRA -will get an echo -will get a carotid ultrasound -aspirin -check lipid panel -will have neurology assess patient -PT/OT to assess  2. COPD -will continue with inhalers  3. Hyperglycemia -monitor FSBS -SSI as needed   4. Hypertension -will monitor pressures -not on antihypertensives currently  5. Hyperlipidemia -on statins    Code Status: full code (must indicate code status--if unknown or must be presumed, indicate so) DVT Prophylaxis:SCD Family Communication: none (indicate person spoken with, if applicable, with phone number if by telephone) Disposition Plan: home (indicate anticipated LOS)    [facility] Pager [phone]

## 2015-09-07 NOTE — ED Provider Notes (Addendum)
CSN: [id number]     Arrival date & time 09/07/15  1854 History   First MD Initiated Contact with Patient 09/07/15 1929     Chief Complaint  Patient presents with  . Aphasia  . Facial Droop      HPI Patient reports that approximately 10:30 this morning he began feeling lightheaded and dizzy.  He then noticed that his speech became abnormal and he felt somewhat clumsy in his left hand.  He felt normal prior to that.  No prior history of stroke.  His wife notices slurred speech recommend they come to the ER for evaluation.  He reports his speech is better but still not baseline.  He still feels slightly abnormal and clumsy with his left hand.  He denies weakness of his lower extremities.  He denies chest pain shortness of breath.  No neck pain.  No other complaints.  No recent illness.   Past Medical History  Diagnosis Date  . Anemia     microcytic  . COPD (chronic obstructive pulmonary disease) ([place])     ?  Marland Kitchen Hyperlipidemia   . GERD (gastroesophageal reflux disease)    Past Surgical History  Procedure Laterality Date  . Knee surgery      LEFT  . Neck surgery      DISC  . Colonoscopy    . Polypectomy     Family History  Problem Relation Age of Onset  . Heart disease Father   . Colon cancer Neg Hx   . Esophageal cancer Neg Hx   . Rectal cancer Neg Hx   . Stomach cancer Neg Hx   . Cancer Neg Hx   . COPD Neg Hx   . Diabetes Neg Hx   . Drug abuse Neg Hx   . Early death Neg Hx   . Hearing loss Neg Hx   . Alcohol abuse Neg Hx   . Asthma Neg Hx   . Stroke Neg Hx   . Hyperlipidemia Neg Hx   . Kidney disease Neg Hx   . Hypertension Neg Hx    Social History  Substance Use Topics  . Smoking status: Former Smoker -- 1.00 packs/day for 50 years    Types: Cigarettes    Quit date: 05/27/2012  . Smokeless tobacco: Never Used  . Alcohol Use: No     Comment: rarely    Review of Systems  All other systems reviewed and are negative.     Allergies  Mevacor  Home  Medications   Prior to Admission medications   Medication Sig Start Date End Date Taking? Authorizing Provider  atorvastatin (LIPITOR) 20 MG tablet TAKE 1 TABLET BY MOUTH EVERY DAY 06/28/15  Yes Janith Lima, MD  ferrous sulfate 325 (65 FE) MG EC tablet Take 1 tablet (325 mg total) by mouth 3 (three) times daily with meals. Patient taking differently: Take 325 mg by mouth daily.  02/05/13  Yes Irene Shipper, MD  gabapentin (NEURONTIN) 800 MG tablet Take 800 mg by mouth 2 (two) times daily.   Yes Historical Provider, MD  pregabalin (LYRICA) 50 MG capsule Take 2 capsules (100 mg total) by mouth 3 (three) times daily. 09/06/15  Yes Janith Lima, MD  traZODone (DESYREL) 100 MG tablet Take 1 tablet (100 mg total) by mouth at bedtime. 02/05/15  Yes Janith Lima, MD  tizanidine (ZANAFLEX) 2 MG capsule Take 1 capsule (2 mg total) by mouth 3 (three) times daily as needed for muscle spasms. Patient not  taking: Reported on 08/17/2015 02/05/15   Janith Lima, MD   BP 163/93 mmHg  Pulse 61  Temp(Src) 98.1 F (36.7 C) (Oral)  Resp 10  SpO2 95% Physical Exam  Constitutional: He is oriented to person, place, and time. He appears well-developed and well-nourished.  HENT:  Head: Normocephalic and atraumatic.  Eyes: EOM are normal.  Neck: Normal range of motion.  Cardiovascular: Normal rate, regular rhythm, normal heart sounds and intact distal pulses.   Pulmonary/Chest: Effort normal and breath sounds normal. No respiratory distress.  Abdominal: Soft. He exhibits no distension. There is no tenderness.  Musculoskeletal: Normal range of motion.  Neurological: He is alert and oriented to person, place, and time.  Abnormal finger to nose with his left hand.  No pronator drift.  Mild dysarthria noted.  No aphasia  Skin: Skin is warm and dry.  Psychiatric: He has a normal mood and affect. Judgment normal.  Nursing note and vitals reviewed.   ED Course  Procedures (including critical care time) Labs  Review Labs Reviewed  CBC - Abnormal; Notable for the following:    Platelets 137 (*)    All other components within normal limits  COMPREHENSIVE METABOLIC PANEL - Abnormal; Notable for the following:    Glucose, Bld 107 (*)    All other components within normal limits  CBG MONITORING, ED - Abnormal; Notable for the following:    Glucose-Capillary 108 (*)    All other components within normal limits  I-STAT CHEM 8, ED - Abnormal; Notable for the following:    Chloride 100 (*)    Glucose, Bld 104 (*)    All other components within normal limits  PROTIME-INR  APTT  DIFFERENTIAL  Randolm Idol, ED    Imaging Review Mr Brain Wo Contrast  09/07/2015   CLINICAL DATA:  Dysarthria and left arm clumsiness beginning earlier today. Left facial droop.  EXAM: MRI HEAD WITHOUT CONTRAST  TECHNIQUE: Multiplanar, multiecho pulse sequences of the brain and surrounding structures were obtained without intravenous contrast.  COMPARISON:  None.  FINDINGS: There is a small acute infarct in the right pons. There is no evidence of intracranial hemorrhage, mass, midline shift, or extra-axial fluid collection. Foci of T2 hyperintensity in the subcortical and deep cerebral white matter bilaterally are nonspecific but compatible with mild-to-moderate chronic small vessel ischemic disease. A chronic lacunar infarct versus dilated perivascular space is noted in the right lentiform nucleus. There is mild generalized cerebral atrophy.  Prior bilateral cataract extraction is noted. Paranasal sinuses and mastoid air cells are clear. Major intracranial vascular flow voids are preserved, with prominent posterior communicating arteries and hypoplastic P1 segments bilaterally.  IMPRESSION: 1. Small acute right pontine infarct. 2. Mild-to-moderate chronic small vessel ischemic disease.   Electronically Signed   By: Logan Bores M.D.   On: 09/07/2015 21:42   I have personally reviewed and evaluated these images and lab results  as part of my medical decision-making.   EKG Interpretation None      MDM   Final diagnoses:  Cerebrovascular accident (CVA), unspecified mechanism ([place])    Patient with acute right pontine infarct on MRI.  Patient will be admitted and transferred to [facility] for evaluation by the stroke team.  No indication for TPA at this time.  Consult neuro hospitalist.  Admit to the medicine service.  Swallow screen now followed by a 1 mg aspirin.  10:29 PM Spoke with Dr Doy Mince of neurology who will evaluate the patient at [facility]  Jola Schmidt, MD 09/07/15 [id number]  Jola Schmidt, MD 09/07/15 2229

## 2015-09-07 NOTE — ED Notes (Signed)
Pt states he went to play golf at 10:30 this am when he began feeling dizzy. It then worsened to him having some slurred speech and difficulty moving his left side. He stayed out with his friends watching them play golf until they got back and he came here. Obvious slurred speech it triage, mild left facial droop, wife states he is much more emotional today than he normally is and is getting off balance easily.

## 2015-09-07 NOTE — ED Notes (Signed)
Attempted to call report to [facility] to for assigned bed. After giving report, the bed assignment has been changed. Will wait for new bed assignment.

## 2015-09-08 ENCOUNTER — Inpatient Hospital Stay (HOSPITAL_COMMUNITY): Payer: Medicare Other

## 2015-09-08 ENCOUNTER — Encounter (HOSPITAL_COMMUNITY): Payer: Medicare Other

## 2015-09-08 ENCOUNTER — Other Ambulatory Visit (HOSPITAL_COMMUNITY): Payer: Medicare Other

## 2015-09-08 ENCOUNTER — Encounter (HOSPITAL_COMMUNITY): Payer: Self-pay | Admitting: Radiology

## 2015-09-08 DIAGNOSIS — I6350 Cerebral infarction due to unspecified occlusion or stenosis of unspecified cerebral artery: Secondary | ICD-10-CM

## 2015-09-08 DIAGNOSIS — E785 Hyperlipidemia, unspecified: Secondary | ICD-10-CM

## 2015-09-08 LAB — URIC ACID: URIC ACID, SERUM: 5.7 mg/dL (ref 4.4–7.6)

## 2015-09-08 LAB — RAPID URINE DRUG SCREEN, HOSP PERFORMED
AMPHETAMINES: NOT DETECTED
BARBITURATES: NOT DETECTED
BENZODIAZEPINES: NOT DETECTED
Cocaine: NOT DETECTED
Opiates: NOT DETECTED
TETRAHYDROCANNABINOL: POSITIVE — AB

## 2015-09-08 LAB — PROTIME-INR
INR: 1.11 (ref 0.00–1.49)
PROTHROMBIN TIME: 14.5 s (ref 11.6–15.2)

## 2015-09-08 LAB — URINALYSIS W MICROSCOPIC (NOT AT ARMC)
Bilirubin Urine: NEGATIVE
GLUCOSE, UA: NEGATIVE mg/dL
Hgb urine dipstick: NEGATIVE
KETONES UR: NEGATIVE mg/dL
Leukocytes, UA: NEGATIVE
Nitrite: NEGATIVE
PH: 7 (ref 5.0–8.0)
Protein, ur: 30 mg/dL — AB
SPECIFIC GRAVITY, URINE: 1.01 (ref 1.005–1.030)
Urobilinogen, UA: 1 mg/dL (ref 0.0–1.0)

## 2015-09-08 LAB — LIPID PANEL
CHOLESTEROL: 151 mg/dL (ref 0–200)
HDL: 35 mg/dL — ABNORMAL LOW (ref >40)
LDL CALC: 94 mg/dL (ref 0–99)
TRIGLYCERIDES: 108 mg/dL (ref <150)
Total CHOL/HDL Ratio: 4.3 RATIO
VLDL: 22 mg/dL (ref 0–40)

## 2015-09-08 LAB — GLUCOSE, CAPILLARY
GLUCOSE-CAPILLARY: 104 mg/dL — AB (ref 65–99)
Glucose-Capillary: 137 mg/dL — ABNORMAL HIGH (ref 65–99)

## 2015-09-08 LAB — LACTATE DEHYDROGENASE: LDH: 168 U/L (ref 98–192)

## 2015-09-08 LAB — PHOSPHORUS: PHOSPHORUS: 3.5 mg/dL (ref 2.5–4.6)

## 2015-09-08 LAB — TROPONIN I

## 2015-09-08 MED ORDER — IPRATROPIUM-ALBUTEROL 0.5-2.5 (3) MG/3ML IN SOLN: 3.0000 mL | Freq: Four times a day (QID) | RESPIRATORY_TRACT | Status: DC | PRN

## 2015-09-08 MED ORDER — PREGABALIN 75 MG PO CAPS: 100.0000 mg | ORAL_CAPSULE | Freq: Three times a day (TID) | ORAL | Status: DC

## 2015-09-08 MED ORDER — PAR-4 STUDY - PLACEBO /  BMS986141 BOTTLE D OR BLISTER PACK
1.0000 | Freq: Every day | Status: DC
  Administered 2015-09-08 – 2015-09-09 (×2): 1 via ORAL
  Filled 2015-09-08 (×3): qty 1

## 2015-09-08 MED ORDER — TRAZODONE HCL 100 MG PO TABS
100.0000 mg | ORAL_TABLET | Freq: Every day | ORAL | Status: DC
Start: 2015-09-08 — End: 2015-09-09
  Administered 2015-09-08: 100 mg via ORAL
  Filled 2015-09-08: qty 1

## 2015-09-08 MED ORDER — ACETAMINOPHEN 325 MG PO TABS: 650.0000 mg | ORAL_TABLET | Freq: Four times a day (QID) | ORAL | Status: DC | PRN

## 2015-09-08 MED ORDER — PAR-4 STUDY - PLACEBO /  BMS986141 BOTTLE C OR BLISTER PACK
1.0000 | Freq: Every day | Status: DC
  Administered 2015-09-08 – 2015-09-09 (×2): 1 via ORAL
  Filled 2015-09-08 (×3): qty 1

## 2015-09-08 MED ORDER — ATORVASTATIN CALCIUM 10 MG PO TABS
20.0000 mg | ORAL_TABLET | Freq: Every day | ORAL | Status: DC
  Administered 2015-09-08 – 2015-09-09 (×2): 20 mg via ORAL
  Filled 2015-09-08 (×2): qty 2

## 2015-09-08 MED ORDER — ASPIRIN 300 MG RE SUPP: 300.0000 mg | Freq: Every day | RECTAL | Status: DC

## 2015-09-08 MED ORDER — PAR-4 STUDY - PLACEBO /  BMS986141 BOTTLE A OR BLISTER PACK
1.0000 | Freq: Every day | Status: DC
  Administered 2015-09-08 – 2015-09-09 (×2): 1 via ORAL
  Filled 2015-09-08 (×3): qty 1

## 2015-09-08 MED ORDER — IOHEXOL 350 MG/ML SOLN
70.0000 mL | Freq: Once | INTRAVENOUS | Status: AC | PRN
  Administered 2015-09-08: 70 mL via INTRAVENOUS

## 2015-09-08 MED ORDER — ASPIRIN 325 MG PO TABS
325.0000 mg | ORAL_TABLET | Freq: Every day | ORAL | Status: DC
  Administered 2015-09-08: 325 mg via ORAL
  Filled 2015-09-08: qty 1

## 2015-09-08 MED ORDER — FERROUS SULFATE 325 (65 FE) MG PO TABS
325.0000 mg | ORAL_TABLET | Freq: Every day | ORAL | Status: DC
  Administered 2015-09-08 – 2015-09-09 (×2): 325 mg via ORAL
  Filled 2015-09-08 (×4): qty 1

## 2015-09-08 MED ORDER — GABAPENTIN 400 MG PO CAPS
800.0000 mg | ORAL_CAPSULE | Freq: Two times a day (BID) | ORAL | Status: DC
  Administered 2015-09-08 – 2015-09-09 (×3): 800 mg via ORAL
  Filled 2015-09-08 (×7): qty 2

## 2015-09-08 MED ORDER — PAR-4 STUDY - PLACEBO /  BMS986141 BOTTLE B OR BLISTER PACK
1.0000 | Freq: Every day | Status: DC
  Administered 2015-09-08 – 2015-09-09 (×2): 1 via ORAL
  Filled 2015-09-08 (×3): qty 1

## 2015-09-08 MED ORDER — SENNOSIDES-DOCUSATE SODIUM 8.6-50 MG PO TABS: 1.0000 | ORAL_TABLET | Freq: Every evening | ORAL | Status: DC | PRN

## 2015-09-08 MED ORDER — STROKE: EARLY STAGES OF RECOVERY BOOK
Freq: Once | Status: AC
  Administered 2015-09-08: 1
  Filled 2015-09-08: qty 1

## 2015-09-08 MED ORDER — BACLOFEN 10 MG PO TABS
5.0000 mg | ORAL_TABLET | Freq: Three times a day (TID) | ORAL | Status: DC
  Administered 2015-09-08 – 2015-09-09 (×2): 5 mg via ORAL
  Filled 2015-09-08 (×2): qty 1

## 2015-09-08 MED ORDER — INSULIN ASPART 100 UNIT/ML ~~LOC~~ SOLN: 0.0000 [IU] | SUBCUTANEOUS | Status: DC

## 2015-09-08 MED ORDER — PAR-4 STUDY - ASPIRIN
162.0000 mg | Freq: Every day | Status: DC
  Administered 2015-09-08 – 2015-09-09 (×2): 162 mg via ORAL
  Filled 2015-09-08 (×4): qty 2

## 2015-09-08 NOTE — Plan of Care (Signed)
Discussed with pt about PARFAIT trial in length and he is interested in the trial and signed consent form. Blood draw and waiting for MRI to complete screening process. Once it is done, will randomization to ASA plus either placebo or investigational meds. [facility] research team is actively working with pt for enrollment and randomization.   Rosalin Hawking, MD PhD Stroke Neurology 09/08/2015 7:29 PM

## 2015-09-08 NOTE — Evaluation (Signed)
Physical Therapy Evaluation Patient Details Name: Nicholas Buck MRN: [id number] DOB: 03-17-1941 Today's Date: 09/08/2015   History of Present Illness  Nicholas Buck is an 74 y.o. male with history of COPD HTN HLD who reports awakening on 10/11 normal. Went to go play golf and noted the onset of dizziness and fatigue at 1030 (LKW). Was off balance but did not fall. Felt that his speech was slurred. 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  He will benefit from skilled PT in the acute setting to allow return home with family support and outpatient PT follow up.  High fall risk with Berg score 35/56 (less than 45 high fall risk.)  Will need RW for home.    Follow Up Recommendations Outpatient PT    Equipment Recommendations  Rolling walker with 5" wheels    Recommendations for Other Services       Precautions / Restrictions Precautions Precautions: Fall      Mobility  Bed Mobility Overal bed mobility: Needs Assistance Bed Mobility: Supine to Sit;Sit to Supine     Supine to sit: Min assist Sit to supine: Supervision   General bed mobility comments: assist for balance after coming upright due to leaning posterior but trying to catch himself  Transfers Overall transfer level: Needs assistance Equipment used: Rolling walker (2 wheeled) Transfers: Sit to/from [employer] Sit to Stand: Supervision   Squat pivot transfers: Supervision     General transfer comment: dependent on UE support for pivot to chair and back, cues for hand placement on bed, not walker  Ambulation/Gait Ambulation/Gait assistance: Min assist;Min guard Ambulation Distance (Feet): 200 Feet Assistive device: Rolling walker (2 wheeled) Gait Pattern/deviations: Decreased stride length;Step-through pattern;Wide base of support      General Gait Details: mild imbalance and halting pattern due to perceived instability, but did not report dizziness  Stairs Stairs: Yes Stairs assistance: Min assist Stair Management: Alternating pattern;Step to pattern;Forwards;Two rails Number of Stairs: 10 General stair comments: step to pattern to descend and step through pattern to ascend  Assist for safety  Wheelchair Mobility    Modified Rankin (Stroke Patients Only) Modified Rankin (Stroke Patients Only) Pre-Morbid Rankin Score: No symptoms Modified Rankin: Moderately severe disability     Balance Overall balance assessment: Needs assistance Sitting-balance support: Feet supported Sitting balance-Leahy Scale: Fair Sitting balance - Comments: once placed upright static able to balance supervision, then with any challenge holds onto support   Standing balance support: Bilateral upper extremity supported Standing balance-Leahy Scale: Fair Standing balance comment: UE support needed for any dynamic movement                 Standardized Balance Assessment Standardized Balance Assessment : Berg Balance Test Berg Balance Test Sit to Stand: Able to stand  independently using hands Standing Unsupported: Able to stand 2 minutes with supervision Sitting with Back Unsupported but Feet Supported on Floor or Stool: Able to sit safely and securely 2 minutes Stand to Sit: Controls descent by using hands Transfers: Able to transfer safely, definite need of hands Standing Unsupported with Eyes Closed: Able to stand 10 seconds with supervision Standing Ubsupported with Feet Together: Able to place feet together independently and stand for 1 minute with supervision From Standing, Reach Forward with Outstretched Arm: Can reach forward >12 cm safely (5") From Standing Position, Pick up Object from Floor:  Able to pick up shoe, needs supervision From Standing Position, Turn to Look Behind Over each Shoulder: Looks behind one side  only/other side shows less weight shift Turn 360 Degrees: Needs assistance while turning Standing Unsupported, Alternately Place Feet on Step/Stool: Able to complete >2 steps/needs minimal assist Standing Unsupported, One Foot in Front: Able to take small step independently and hold 30 seconds Standing on One Leg: Tries to lift leg/unable to hold 3 seconds but remains standing independently Total Score: 35         Pertinent Vitals/Pain Pain Assessment: No/denies pain    Home Living Family/patient expects to be discharged to:: Private residence Living Arrangements: Spouse/significant other Available Help at Discharge: Family Type of Home: House Home Access: Level entry     Home Layout: Two level;Bed/bath upstairs Home Equipment: None      Prior Function Level of Independence: Independent         Comments: played golf, worked in yard     Journalist, newspaper   Dominant Hand: Right    Extremity/Trunk Assessment   Upper Extremity Assessment: Defer to OT evaluation           Lower Extremity Assessment: Overall WFL for tasks assessed         Communication   Communication: No difficulties  Cognition Arousal/Alertness: Awake/alert Behavior During Therapy: WFL for tasks assessed/performed (labile at end of session crying due to noted deficits on eval) Overall Cognitive Status: Within Functional Limits for tasks assessed                      General Comments      Exercises        Assessment/Plan    PT Assessment Patient needs continued PT services  PT Diagnosis Abnormality of gait   PT Problem List Decreased knowledge of use of DME;Decreased safety awareness;Decreased balance;Decreased knowledge of precautions;Decreased mobility;Decreased coordination  PT Treatment Interventions DME instruction;Balance training;Gait training;Neuromuscular re-education;Stair training;Functional mobility training;Patient/family education;Therapeutic exercise   PT Goals  (Current goals can be found in the Care Plan section) Acute Rehab PT Goals Patient Stated Goal: To return to active lifestyle PT Goal Formulation: With patient Time For Goal Achievement: 09/22/15 Potential to Achieve Goals: Good    Frequency Min 4X/week   Barriers to discharge        Co-evaluation               End of Session Equipment Utilized During Treatment: Gait belt Activity Tolerance: Patient tolerated treatment well Patient left: in bed;with call bell/phone within reach;with bed alarm set           Time: 1340-1410 PT Time Calculation (min) (ACUTE ONLY): 30 min   Charges:   PT Evaluation $Initial PT Evaluation Tier I: 1 Procedure PT Treatments $Gait Training: 8-22 mins   PT G Codes:        Nicholas Buck,CYNDI 2015-09-11, 2:17 PM  Nicholas Buck, [place] 09-11-2015

## 2015-09-08 NOTE — Progress Notes (Signed)
Patient seen and evaluated earlier the same by my associate. Please refer to H&P for details regarding assessment and plan.  Neurology on board and patient undergoing further neurological workup.  Patient seen and evaluated Gen. patient in no acute distress Cardiovascular: No cyanosis Pulmonary: No increased work of breathing, no wheezes  We'll reassess next a.m. unless there is acute medical problem requiring reevaluation.  Kaya Klausing, [employer]

## 2015-09-08 NOTE — Evaluation (Signed)
Speech Language Pathology Evaluation Patient Details Name: Nicholas Buck MRN: [id number] DOB: 1941-10-18 Today's Date: 09/08/2015 Time: 1206-1233 SLP Time Calculation (min) (ACUTE ONLY): 27 min  Problem List:  Patient Active Problem List   Diagnosis Date Noted  . Right pontine stroke ([place]) 09/07/2015  . Stroke (cerebrum) ([place]) 09/07/2015  . Acute embolic stroke ([place]) 10/93/2355  . Essential hypertension 08/17/2015  . Other seasonal allergic rhinitis 02/05/2015  . Insomnia due to anxiety and fear 02/05/2015  . Bilateral low back pain without sciatica 02/05/2015  . Hereditary and idiopathic peripheral neuropathy 07/22/2013  . 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 Routine general medical examination at a health care facility 04/18/2013  . Hyperglycemia 04/18/2013  . Hyperlipidemia with target LDL less than 130 02/15/2013  . COPD GOLD I 01/17/2013  . Microcytic anemia 01/17/2013   Past Medical History:  Past Medical History  Diagnosis Date  . Anemia     microcytic  . COPD (chronic obstructive pulmonary disease) ([place])     ?  Marland Kitchen Hyperlipidemia   . GERD (gastroesophageal reflux disease)    Past Surgical History:  Past Surgical History  Procedure Laterality Date  . Knee surgery      LEFT  . Neck surgery      DISC  . Colonoscopy    . Polypectomy     HPI:  Nicholas Buck is an 74 y.o. male who reports awakening on 10/11 normal. Went to go play golf and noted the onset of dizziness and fatigue at 1030 (LKW). Was off balance but did not fall. Felt that his speech was slurred. Waited throughout the day in hopes that his symptoms would improve but when they did not presented for evaluation to [facility]. MRI revealed right pontine infarct     Assessment / Plan / Recommendation Clinical Impression   Pt presents with grossly intact cognitive-linguistic function for all tasks assessed.  No focal oral motor weakness was evident upon exam and pt's speech was fluent and clear of  dysarthria or word finding deficits.  Pt endorses return to cognitive baseline and was noted with functional 4 word delayed recall, sustained and selective attention, as well as planning, organization, and error awareness for problem solving.   Per report, pt passed stroke swallow screen and has been tolerating his currently prescribed diet without difficulty.  As a result, no further ST needs are indicated at this time.      SLP Assessment  Patient does not need any further Speech Lanaguage Pathology Services          Pertinent Vitals/Pain Pain Assessment: No/denies pain   SLP Goals     SLP Evaluation Prior Functioning  Cognitive/Linguistic Baseline: Within functional limits Type of Home: House  Lives With: Spouse Available Help at Discharge: Family Vocation: Retired   Associate Professor  Overall Cognitive Status: Within Functional Limits for tasks assessed Arousal/Alertness: Awake/alert Orientation Level: Oriented X4 Attention: Sustained;Selective Sustained Attention: Appears intact Selective Attention: Appears intact Memory: Appears intact Awareness: Appears intact Problem Solving: Appears intact Safety/Judgment: Appears intact    Comprehension  Auditory Comprehension Overall Auditory Comprehension: Appears within functional limits for tasks assessed    Expression Expression Primary Mode of Expression: Verbal Verbal Expression Overall Verbal Expression: Appears within functional limits for tasks assessed Written Expression Dominant Hand: Right   Oral / Motor Oral Motor/Sensory Function Overall Oral Motor/Sensory Function: Appears within functional limits for tasks assessed Motor Speech Overall Motor Speech: Appears within functional limits for tasks assessed   GO  Belal Scallon, Selinda Orion 09/08/2015, 12:46 PM

## 2015-09-08 NOTE — Progress Notes (Signed)
Occupational Therapy Evaluation Patient Details Name: Nicholas Buck MRN: [id number] DOB: 05-Sep-1941 Today's Date: 09/08/2015    History of Present Illness Nicholas Buck is an 74 y.o. male with history of COPD HTN HLD who reports awakening on 10/11 normal. Went to go play golf and noted the onset of dizziness and fatigue at 1030 (LKW). Was off balance but did not fall. Felt that his speech was slurred. 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 Pt will benefit from continued acute OT to address the below listed deficits and maximize independence with BADLs prior to d/c home. PTA pt was independent with ADLs. Pt presents with decreased gross and fine motor coordination in Lt hand and grossly 4/5 LUE strength. Pt also presents with some decreased standing balance. Pt is currently min guard to min A with ADLs. Session details below. OT to continue to follow acutely.      Follow Up Recommendations  Outpatient OT;Supervision - Intermittent;Other (comment) (OOB/mobility)    Equipment Recommendations  None recommended by OT    Recommendations for Other Services       Precautions / Restrictions Precautions Precautions: Fall Restrictions Weight Bearing Restrictions: No      Mobility Bed Mobility Overal bed mobility: Needs Assistance Bed Mobility: Sit to Supine     Supine to sit: Min assist Sit to supine: Supervision   General bed mobility comments: assist for balance after coming upright due to leaning posterior but trying to catch himself  Transfers Overall transfer level: Needs assistance Equipment used: Rolling walker (2 wheeled) Transfers: Sit to/from Stand Sit to Stand: Min guard   Squat pivot transfers: Supervision     General transfer comment: educated on hand placement. Pt mostly min guard. Min A for stand>sit during toilet transfer with  pt with both hands on rw suring transition resulting in LOB. Reinfored education on hand placement during rw transfers.     Balance Overall balance assessment: Needs assistance Sitting-balance support: No upper extremity supported;Feet supported Sitting balance-Leahy Scale: Fair Sitting balance - Comments: once placed upright static able to balance supervision, then with any challenge holds onto support   Standing balance support: No upper extremity supported;Bilateral upper extremity supported Standing balance-Leahy Scale: Fair Standing balance comment: stood to complete oral care                         ADL Overall ADL's : Needs assistance/impaired Eating/Feeding: Set up;Sitting   Grooming: Standing;Min guard;Oral care Grooming Details (indicate cue type and reason): difficulty opening packets and screw top lids; able to due so but with increased time and effort. Upper Body Bathing: Min guard;Sitting   Lower Body Bathing: Min guard;Sit to/from stand   Upper Body Dressing : Min guard;Sitting   Lower Body Dressing: Min guard;Sit to/from stand   Toilet Transfer: Min guard;Minimal assistance;RW;Comfort height toilet;Grab bars Armed forces technical officer Details (indicate cue type and reason): 1 LOB during stand>sit due to pt with both hands on rw as transitioning despite prior cues for hand placement. Pt able to correct balance with min A from therpist. Reinforced hand placement with rw.  Toileting- Water quality scientist and Hygiene: Min guard;Sit to/from stand   Tub/ Shower Transfer: Walk-in shower;Min guard;Ambulation;3 in 1;Rolling walker   Functional mobility during ADLs: Min guard;Rolling walker General ADL Comments: Pt completed oral care standing at sink, toilet transfer, and in-room functional  mobility as detailed above. [place] deficits noted. Provided [facility] handout and educated on exercises. Discussed recommendation that pt sit to shower. Pt currently indicating intention to stand  to shower at home. Educated on having spouse with him, using wall to stabize self.      Vision     Perception     Praxis      Pertinent Vitals/Pain Pain Assessment: No/denies pain     Hand Dominance Right   Extremity/Trunk Assessment Upper Extremity Assessment Upper Extremity Assessment: LUE deficits/detail LUE Deficits / Details: grossly 4/5; stronger proximal than distal; gross<fine motor coordination deficits noted in left hand. LUE Coordination: decreased fine motor;decreased gross motor   Lower Extremity Assessment Lower Extremity Assessment: Defer to PT evaluation       Communication Communication Communication: No difficulties   Cognition Arousal/Alertness: Awake/alert Behavior During Therapy: WFL for tasks assessed/performed Overall Cognitive Status: Within Functional Limits for tasks assessed                     General Comments       Exercises Exercises:  [facility], handout provided, educated on chair pushups)     Shoulder Instructions      Home Living Family/patient expects to be discharged to:: Private residence Living Arrangements: Spouse/significant other Available Help at Discharge: Family Type of Home: House Home Access: Level entry     Home Layout: Two level;Bed/bath upstairs Alternate Level Stairs-Number of Steps: 13-14 Alternate Level Stairs-Rails: None Bathroom Shower/Tub: Teacher, early years/pre: Standard     Home Equipment: None   Additional Comments: Pt stating he does not want/plan to use shower seat. Explained benefits of shower seat. Discussed safety with showering and having someone with him, using wall to stabilize self.  Lives With: Spouse    Prior Functioning/Environment Level of Independence: Independent        Comments: played golf, worked in yard    OT Diagnosis: Paresis;Other (comment) (decreased standing balance)   OT Problem List: Decreased strength;Impaired balance (sitting and/or  standing);Decreased coordination;Decreased knowledge of use of DME or AE;Decreased knowledge of precautions;Impaired UE functional use   OT Treatment/Interventions: Self-care/ADL training;Therapeutic exercise;Neuromuscular education;DME and/or AE instruction;Therapeutic activities;Patient/family education;Balance training    OT Goals(Current goals can be found in the care plan section) Acute Rehab OT Goals Patient Stated Goal: To return to active lifestyle OT Goal Formulation: With patient Time For Goal Achievement: 09/15/15 Potential to Achieve Goals: Good ADL Goals Pt Will Perform Grooming: with modified independence;standing Pt Will Perform Upper Body Bathing: with modified independence;sitting;standing Pt Will Perform Lower Body Bathing: with modified independence;sit to/from stand;with adaptive equipment Pt Will Transfer to Toilet: with modified independence;ambulating;regular height toilet;grab bars Pt Will Perform Toileting - Clothing Manipulation and hygiene: with modified independence;sitting/lateral leans;sit to/from stand Pt Will Perform Tub/Shower Transfer: Tub transfer;with modified independence;ambulating;rolling walker;3 in 1 Pt/caregiver will Perform Home Exercise Program: Increased strength;Left upper extremity;With theraband;With theraputty;With written HEP provided [facility])  OT Frequency: Min 2X/week   Barriers to D/C:            Co-evaluation              End of Session Equipment Utilized During Treatment: Rolling walker  Activity Tolerance: Patient tolerated treatment well Patient left: in bed;with call bell/phone within reach;with bed alarm set;with SCD's reapplied   Time: 1415-1440 OT Time Calculation (min): 25 min Charges:  OT General Charges $OT Visit: 1 Procedure OT Evaluation $Initial OT Evaluation Tier I: 1 Procedure OT Treatments $Self Care/Home  Management : 8-22 mins G-Codes:    Hortencia Pilar 09-13-2015, 2:59 PM

## 2015-09-08 NOTE — Progress Notes (Signed)
Utilization review completed. Keria Widrig, RN, BSN. 

## 2015-09-08 NOTE — Consult Note (Signed)
Referring Physician: Humphrey Rolls    Chief Complaint: Dizziness  HPI: Nicholas Buck is an 74 y.o. male who reports awakening on 10/11 normal.  Went to go play golf and noted the onset of dizziness and fatigue.  Was off balance but did not fall. Felt that his speech was slurred.  Waited throughout the day in hopes that his symptoms would improve but when they did not presented for evaluation to [facility].  Date last known well: Date: 09/07/2015 Time last known well: Time: 10:30 tPA Given: No: Outside time window  MRankin: 0  Past Medical History  Diagnosis Date  . Anemia     microcytic  . COPD (chronic obstructive pulmonary disease) ([place])     ?  Marland Kitchen Hyperlipidemia   . GERD (gastroesophageal reflux disease)     Past Surgical History  Procedure Laterality Date  . Knee surgery      LEFT  . Neck surgery      DISC  . Colonoscopy    . Polypectomy      Family History  Problem Relation Age of Onset  . Heart disease Father   . Colon cancer Neg Hx   . Esophageal cancer Neg Hx   . Rectal cancer Neg Hx   . Stomach cancer Neg Hx   . Cancer Neg Hx   . COPD Neg Hx   . Diabetes Neg Hx   . Drug abuse Neg Hx   . Early death Neg Hx   . Hearing loss Neg Hx   . Alcohol abuse Neg Hx   . Asthma Neg Hx   . Stroke Neg Hx   . Hyperlipidemia Neg Hx   . Kidney disease Neg Hx   . Hypertension Neg Hx    Social History:  reports that he quit smoking about 3 years ago. His smoking use included Cigarettes. He has a 50 pack-year smoking history. He has never used smokeless tobacco. He reports that he does not drink alcohol or use illicit drugs.  Allergies:  Allergies  Allergen Reactions  . Mevacor [Lovastatin]     Muscle aches    Medications:  I have reviewed the patient's current medications. Prior to Admission:  Prescriptions prior to admission  Medication Sig Dispense Refill Last Dose  . atorvastatin (LIPITOR) 20 MG tablet TAKE 1 TABLET BY MOUTH EVERY DAY 90 tablet 3 09/07/2015 at Unknown time   . ferrous sulfate 325 (65 FE) MG EC tablet Take 1 tablet (325 mg total) by mouth 3 (three) times daily with meals. (Patient taking differently: Take 325 mg by mouth daily. ) 90 tablet 2 09/06/2015 at Unknown time  . gabapentin (NEURONTIN) 800 MG tablet Take 800 mg by mouth 2 (two) times daily.   09/07/2015 at Unknown time  . pregabalin (LYRICA) 50 MG capsule Take 2 capsules (100 mg total) by mouth 3 (three) times daily. 252 capsule 0 09/03/2015 at Unknown time  . traZODone (DESYREL) 100 MG tablet Take 1 tablet (100 mg total) by mouth at bedtime. 90 tablet 3 09/06/2015 at Unknown time  . tizanidine (ZANAFLEX) 2 MG capsule Take 1 capsule (2 mg total) by mouth 3 (three) times daily as needed for muscle spasms. (Patient not taking: Reported on 08/17/2015) 90 capsule 2 Completed Course at Unknown time   Scheduled: . aspirin  300 mg Rectal Daily   Or  . aspirin  325 mg Oral Daily  . atorvastatin  20 mg Oral Daily  . ferrous sulfate  325 mg Oral Daily  . gabapentin  800 mg Oral BID  . insulin aspart  0-15 Units Subcutaneous 6 times per day  . traZODone  100 mg Oral QHS    ROS: History obtained from the patient  General ROS: negative for - chills, fatigue, fever, night sweats, weight gain or weight loss Psychological ROS: negative for - behavioral disorder, hallucinations, memory difficulties, mood swings or suicidal ideation Ophthalmic ROS: negative for - blurry vision, double vision, eye pain or loss of vision ENT ROS: negative for - epistaxis, nasal discharge, oral lesions, sore throat, tinnitus or vertigo Allergy and Immunology ROS: negative for - hives or itchy/watery eyes Hematological and Lymphatic ROS: negative for - bleeding problems, bruising or swollen lymph nodes Endocrine ROS: negative for - galactorrhea, hair pattern changes, polydipsia/polyuria or temperature intolerance Respiratory ROS: negative for - cough, hemoptysis, shortness of breath or wheezing Cardiovascular ROS: negative  for - chest pain, dyspnea on exertion, edema or irregular heartbeat Gastrointestinal ROS: negative for - abdominal pain, diarrhea, hematemesis, nausea/vomiting or stool incontinence Genito-Urinary ROS: negative for - dysuria, hematuria, incontinence or urinary frequency/urgency Musculoskeletal ROS: negative for - joint swelling or muscular weakness Neurological ROS: as noted in HPI, tingling in hand and feet, muscle cramps Dermatological ROS: negative for rash and skin lesion changes  Physical Examination: Blood pressure 174/95, pulse 70, temperature 98.2 F (36.8 C), temperature source Oral, resp. rate 16, height '6\' 2"'$  (1.88 m), weight 83.144 kg (183 lb 4.8 oz), SpO2 97 %.  HEENT-  Normocephalic, no lesions, without obvious abnormality.  Normal external eye and conjunctiva.  Normal TM's bilaterally.  Normal auditory canals and external ears. Normal external nose, mucus membranes and septum.  Normal pharynx. Cardiovascular- S1, S2 normal, pulses palpable throughout   Lungs- Heart exam - S1, S2 normal, no murmur, no gallop, rate regular Abdomen- soft, non-tender; bowel sounds normal; no masses,  no organomegaly Extremities- no edema Lymph-no adenopathy palpable Musculoskeletal-no joint tenderness, deformity or swelling Skin-warm and dry, no hyperpigmentation, vitiligo, or suspicious lesions  Neurological Examination Mental Status: Alert, oriented, thought content appropriate.  Speech fluent without evidence of aphasia.  Mild dysarthria.  Able to follow 3 step commands without difficulty. Cranial Nerves: II: Discs flat bilaterally; Visual fields grossly normal, pupils equal, round, reactive to light and accommodation III,IV, VI: ptosis not present, extra-ocular motions intact bilaterally V,VII: decrease in right NLF, facial light touch sensation normal bilaterally VIII: hearing normal bilaterally IX,X: gag reflex present XI: bilateral shoulder shrug XII: midline tongue  extension Motor: Right : Upper extremity   5/5    Left:     Upper extremity   5/5  Lower extremity   5/5     Lower extremity   5/5 Tone and bulk:normal tone throughout; no atrophy noted Sensory: Pinprick and light touch intact throughout, bilaterally Deep Tendon Reflexes: 2+ and symmetric with absent AJ's bilaterally Plantars: Right: equivocal   Left: upgoing Cerebellar: Dysmetria with finger to nose testing on the left Gait: not tested due to safety concerns    Laboratory Studies:  Basic Metabolic Panel:  Recent Labs Lab 09/07/15 1940 09/07/15 1951  NA 136 139  K 3.9 3.9  CL 104 100*  CO2 25  --   GLUCOSE 107* 104*  BUN 11 10  CREATININE 0.81 0.90  CALCIUM 9.5  --     Liver Function Tests:  Recent Labs Lab 09/07/15 1940  AST 28  ALT 24  ALKPHOS 83  BILITOT 1.2  PROT 7.5  ALBUMIN 4.7   No results for input(s): LIPASE, AMYLASE  in the last 168 hours. No results for input(s): AMMONIA in the last 168 hours.  CBC:  Recent Labs Lab 09/07/15 1940 09/07/15 1951  WBC 5.8  --   NEUTROABS 3.4  --   HGB 14.2 15.0  HCT 40.6 44.0  MCV 91.9  --   PLT 137*  --     Cardiac Enzymes:  Recent Labs Lab 09/08/15 0422  TROPONINI <0.03    BNP: Invalid input(s): POCBNP  CBG:  Recent Labs Lab 09/07/15 1918 09/08/15 0351  GLUCAP 108* 137*    Microbiology: No results found for this or any previous visit.  Coagulation Studies:  Recent Labs  09/07/15 1940 09/08/15 0422  LABPROT 14.0 14.5  INR 1.06 1.11    Urinalysis: No results for input(s): COLORURINE, LABSPEC, PHURINE, GLUCOSEU, HGBUR, BILIRUBINUR, KETONESUR, PROTEINUR, UROBILINOGEN, NITRITE, LEUKOCYTESUR in the last 168 hours.  Invalid input(s): APPERANCEUR  Lipid Panel:    Component Value Date/Time   CHOL 151 09/08/2015 0422   TRIG 108 09/08/2015 0422   HDL 35* 09/08/2015 0422   CHOLHDL 4.3 09/08/2015 0422   VLDL 22 09/08/2015 0422   LDLCALC 94 09/08/2015 0422    HgbA1C:  Lab Results   Component Value Date   HGBA1C 5.6 07/20/2015    Urine Drug Screen:  No results found for: LABOPIA, COCAINSCRNUR, LABBENZ, AMPHETMU, THCU, LABBARB  Alcohol Level: No results for input(s): ETH in the last 168 hours.  Other results: EKG: sinus rhythm at 80 bpm.  Imaging: Mr Brain Wo Contrast  09/07/2015  CLINICAL DATA:  Dysarthria and left arm clumsiness beginning earlier today. Left facial droop. EXAM: MRI HEAD WITHOUT CONTRAST TECHNIQUE: Multiplanar, multiecho pulse sequences of the brain and surrounding structures were obtained without intravenous contrast. COMPARISON:  None. FINDINGS: There is a small acute infarct in the right pons. There is no evidence of intracranial hemorrhage, mass, midline shift, or extra-axial fluid collection. Foci of T2 hyperintensity in the subcortical and deep cerebral white matter bilaterally are nonspecific but compatible with mild-to-moderate chronic small vessel ischemic disease. A chronic lacunar infarct versus dilated perivascular space is noted in the right lentiform nucleus. There is mild generalized cerebral atrophy. Prior bilateral cataract extraction is noted. Paranasal sinuses and mastoid air cells are clear. Major intracranial vascular flow voids are preserved, with prominent posterior communicating arteries and hypoplastic P1 segments bilaterally. IMPRESSION: 1. Small acute right pontine infarct. 2. Mild-to-moderate chronic small vessel ischemic disease. Electronically Signed   By: Logan Bores M.D.   On: 09/07/2015 21:42    Assessment: 74 y.o. male presenting with dizziness, gait ataxia and slurred speech.  MRI of the brain personally reviewed and shows a small right pontine infarct.  Patient on no antiplatelet therapy at home.  Further work up recommended.    Stroke Risk Factors - hyperlipidemia  Plan: 1. HgbA1c, fasting lipid panel 2. Frequent neuro checks 3. PT consult, OT consult, Speech consult 4. Echocardiogram 5. Carotid dopplers 6.  Prophylactic therapy-Antiplatelet med: Aspirin - dose '325mg'$  daily 7. NPO until RN stroke swallow screen 8. Telemetry monitoring     Alexis Goodell, MD [facility] [phone] 09/08/2015, 7:02 AM

## 2015-09-08 NOTE — Progress Notes (Signed)
Pt arrived on unit 0310 hrs via [facility],, A&Ox4 though forgetful, no obvious distress, NIHSS 2, denies any pain. On call notified of Pt arrival, Pt oriented to room/equipment, transfer/admission orders implemented.

## 2015-09-08 NOTE — Progress Notes (Signed)
OT Cancellation Note  Patient Details Name: SUHAN PACI MRN: [id number] DOB: 06/29/1941   Cancelled Treatment:    Reason Eval/Treat Not Completed: Medical issues which prohibited therapy. Pt with active bed rest order. Will hold OT evaluation until activity orders increased.   Hortencia Pilar 09/08/2015, 8:54 AM

## 2015-09-08 NOTE — Progress Notes (Signed)
STROKE TEAM PROGRESS NOTE   HISTORY Nicholas Buck is an 74 y.o. male who reports awakening on 10/11 normal. Went to go play golf and noted the onset of dizziness and fatigue at 1030 (LKW). Was off balance but did not fall. Felt that his speech was slurred. Waited throughout the day in hopes that his symptoms would improve but when they did not presented for evaluation to [facility]. Patient was not administered TPA secondary to being outside time window. He was admitted for further evaluation and treatment.   SUBJECTIVE (INTERVAL HISTORY) No family is at the bedside.  Overall he feels his condition is stable. Still has left hand clumsiness, but no more dizziness. Had MRI showed acute small pontine infarct. Not take antiplatelet at home. Discussed with pt about PARFAIT trail and he is interested.   OBJECTIVE Temp:  [98.1 F (36.7 C)-98.6 F (37 C)] 98.6 F (37 C) (10/12 0947) Pulse Rate:  [61-76] 70 (10/12 0730) Cardiac Rhythm:  [-] Normal sinus rhythm;Heart block (10/12 0700) Resp:  [10-17] 16 (10/12 0730) BP: (132-179)/(80-107) 160/80 mmHg (10/12 0947) SpO2:  [94 %-98 %] 97 % (10/12 0530) Weight:  [83.144 kg (183 lb 4.8 oz)-87.544 kg (193 lb)] 83.144 kg (183 lb 4.8 oz) (10/12 0330)  CBC:   Recent Labs Lab 09/07/15 1940 09/07/15 1951  WBC 5.8  --   NEUTROABS 3.4  --   HGB 14.2 15.0  HCT 40.6 44.0  MCV 91.9  --   PLT 137*  --     Basic Metabolic Panel:   Recent Labs Lab 09/07/15 1940 09/07/15 1951  NA 136 139  K 3.9 3.9  CL 104 100*  CO2 25  --   GLUCOSE 107* 104*  BUN 11 10  CREATININE 0.81 0.90  CALCIUM 9.5  --     Lipid Panel:     Component Value Date/Time   CHOL 151 09/08/2015 0422   TRIG 108 09/08/2015 0422   HDL 35* 09/08/2015 0422   CHOLHDL 4.3 09/08/2015 0422   VLDL 22 09/08/2015 0422   LDLCALC 94 09/08/2015 0422   HgbA1c:  Lab Results  Component Value Date   HGBA1C 5.6 07/20/2015   Urine Drug Screen: No results found for: LABOPIA, COCAINSCRNUR,  LABBENZ, AMPHETMU, THCU, LABBARB    IMAGING I have personally reviewed the radiological images below and agree with the radiology interpretations.  Mr Brain Wo Contrast 09/07/2015  1. Small acute right pontine infarct. 2. Mild-to-moderate chronic small vessel ischemic disease.   Ct Angio Head and neck W/cm &/or Wo Cm  09/08/2015  IMPRESSION: 1. No acute arterial finding to explain acute pontine infarct. 2. Atherosclerosis without cervical or intracranial flow limiting stenosis. 3. Emphysema.   2D echo pending  PHYSICAL EXAM  Temp:  [98.1 F (36.7 C)-98.6 F (37 C)] 98.6 F (37 C) (10/12 1845) Pulse Rate:  [61-76] 76 (10/12 1845) Resp:  [10-20] 20 (10/12 1845) BP: (158-179)/(80-107) 179/89 mmHg (10/12 1845) SpO2:  [94 %-98 %] 96 % (10/12 1845) Weight:  [183 lb 4.8 oz (83.144 kg)-193 lb (87.544 kg)] 183 lb 4.8 oz (83.144 kg) (10/12 0330)  General - Well nourished, well developed, in no apparent distress.  Ophthalmologic - Sharp disc margins OU.  Cardiovascular - Regular rate and rhythm with no murmur.  Mental Status -  Level of arousal and orientation to time, place, and person were intact. Language including expression, naming, repetition, comprehension was assessed and found intact. Fund of Knowledge was assessed and was intact.  Cranial Nerves  II - XII - II - Visual field intact OU. III, IV, VI - Extraocular movements intact. V - Facial sensation intact bilaterally. VII - Facial movement intact bilaterally. VIII - Hearing & vestibular intact bilaterally. X - Palate elevates symmetrically. XI - Chin turning & shoulder shrug intact bilaterally. XII - Tongue protrusion intact.  Motor Strength - The patient's strength was normal in all extremities except left hand mild dexterity difficulty and pronator drift was absent.  Bulk was normal and fasciculations were absent.   Motor Tone - Muscle tone was assessed at the neck and appendages and was normal.  Reflexes - The  patient's reflexes were 1+ in all extremities and he had no pathological reflexes.  Sensory - Light touch, temperature/pinprick were assessed and were symmetrical.    Coordination - The patient had mild left FTN dysmetria, subtle left HTS dysmetria.  Tremor was absent.  Gait and Station - deferred to PT in room.   ASSESSMENT/PLAN [name] is a 74 y.o. male with history of HLD and COPD presenting with dizziness and fatigue. He did not receive IV t-PA due to being outside the window.   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   Patient counseled to be compliant with his antithrombotic medications  Ongoing aggressive stroke risk factor management Patient interested in considering PARFAIT research study. Dr. Erlinda Buck spoke with patient. [facility] will follow up.  Therapy recommendations:  Pending. Ok to be OOB, orders cancelled  Disposition:  pending   Hypertension  Stable Permissive hypertension (OK if < 220/120) but gradually normalize in 5-7 days No BP meds at [facility]  Hyperlipidemia  Home meds:  lipitor 20, resumed in hospital  LDL 94, goal < 70  Continue statin at discharge  Other Stroke Risk Factors  Advanced age  Former Cigarette smoker, quit smoking 3 years ago   Other Active Problems  COPD  Hospital day # [place] for Pager information 09/08/2015 11:39 AM   I, the attending vascular neurologist, have personally obtained a history,  examined the patient, evaluated laboratory data, individually viewed imaging studies and agree with radiology interpretations. Together with the NP/PA, we formulated the assessment and plan of care which reflects our mutual decision.  I have made any additions or clarifications directly to the above note and agree with the findings and plan as currently documented.   74 yo with Hx of HLD admitted for small pontine infarct. Symptoms getting better but still has left hand clumsiness and mild dysmetria. CTA head and neck unremarkable. 2D echo pending. LDL 94 and A1C pending. Put on ASA and statin. Pt interested in PARFAIT trial.   Rosalin Hawking, MD PhD Stroke Neurology 09/08/2015 7:25 PM   To contact Stroke Continuity provider, please refer to [email or web address] After hours, contact General Neurology

## 2015-09-08 NOTE — Progress Notes (Signed)
Patient refused SCD's educated will continue to monitor. Joaquin Bend E, RN 09/08/2015 10:12 PM

## 2015-09-09 ENCOUNTER — Inpatient Hospital Stay (HOSPITAL_COMMUNITY): Payer: Medicare Other

## 2015-09-09 DIAGNOSIS — E785 Hyperlipidemia, unspecified: Secondary | ICD-10-CM | POA: Insufficient documentation

## 2015-09-09 DIAGNOSIS — I6789 Other cerebrovascular disease: Secondary | ICD-10-CM

## 2015-09-09 LAB — URINALYSIS W MICROSCOPIC (NOT AT ARMC)
BILIRUBIN URINE: NEGATIVE
Glucose, UA: NEGATIVE mg/dL
HGB URINE DIPSTICK: NEGATIVE
KETONES UR: NEGATIVE mg/dL
LEUKOCYTES UA: NEGATIVE
NITRITE: NEGATIVE
PROTEIN: 30 mg/dL — AB
Specific Gravity, Urine: 1.023 (ref 1.005–1.030)
Urobilinogen, UA: 1 mg/dL (ref 0.0–1.0)
pH: 5 (ref 5.0–8.0)

## 2015-09-09 LAB — HEMOGLOBIN A1C
HEMOGLOBIN A1C: 5.6 % (ref 4.8–5.6)
MEAN PLASMA GLUCOSE: 114 mg/dL

## 2015-09-09 NOTE — Progress Notes (Signed)
STROKE TEAM PROGRESS NOTE   SUBJECTIVE (INTERVAL HISTORY) Patient lying in the bed. Complains of cramping in his left leg asking MD what caused it. He also has some mild pain after cramping. On tylenol PRN. PT recommend outpt PT. He will get a rolling walker.    OBJECTIVE Temp:  [98.4 F (36.9 C)-98.6 F (37 C)] 98.6 F (37 C) (10/13 0545) Pulse Rate:  [70-76] 70 (10/13 0545) Cardiac Rhythm:  [-] Normal sinus rhythm (10/13 0700) Resp:  [18-20] 18 (10/13 0545) BP: (166-182)/(80-89) 166/80 mmHg (10/13 0545) SpO2:  [96 %-97 %] 96 % (10/13 0545)  CBC:   Recent Labs Lab 09/07/15 1940 09/07/15 1951  WBC 5.8  --   NEUTROABS 3.4  --   HGB 14.2 15.0  HCT 40.6 44.0  MCV 91.9  --   PLT 137*  --    Basic Metabolic Panel:   Recent Labs Lab 09/07/15 1940 09/07/15 1951 09/08/15 1754  NA 136 139  --   K 3.9 3.9  --   CL 104 100*  --   CO2 25  --   --   GLUCOSE 107* 104*  --   BUN 11 10  --   CREATININE 0.81 0.90  --   CALCIUM 9.5  --   --   PHOS  --   --  3.5   Lipid Panel:     Component Value Date/Time   CHOL 151 09/08/2015 0422   TRIG 108 09/08/2015 0422   HDL 35* 09/08/2015 0422   CHOLHDL 4.3 09/08/2015 0422   VLDL 22 09/08/2015 0422   LDLCALC 94 09/08/2015 0422   HgbA1c:  Lab Results  Component Value Date   HGBA1C 5.6 09/08/2015   Urine Drug Screen:     Component Value Date/Time   LABOPIA NONE DETECTED 09/08/2015 1708   COCAINSCRNUR NONE DETECTED 09/08/2015 1708   LABBENZ NONE DETECTED 09/08/2015 1708   AMPHETMU NONE DETECTED 09/08/2015 1708   THCU POSITIVE* 09/08/2015 1708   LABBARB NONE DETECTED 09/08/2015 1708      IMAGING I have personally reviewed the radiological images below and agree with the radiology interpretations.  Mr Brain Wo Contrast 09/07/2015  1. Small acute right pontine infarct. 2. Mild-to-moderate chronic small vessel ischemic disease.   Ct Angio Head and neck W/cm &/or Wo Cm 09/08/2015  IMPRESSION: 1. No acute arterial finding  to explain acute pontine infarct. 2. Atherosclerosis without cervical or intracranial flow limiting stenosis. 3. Emphysema.   2D echo - Left ventricle: Intracavitary gradient with a peak velocity of 2.3 m/sec. Peak systolic pressure 16 mmHg. The cavity size was at the lower limits of normal. There was mild concentric hypertrophy. Systolic function was normal. The estimated ejection fraction was in the range of 60% to 65%. Wall motion was normal; there were no regional wall motion abnormalities. Doppler parameters are consistent with abnormal left ventricular relaxation (grade 1 diastolic dysfunction). - Aortic valve: Probably trileaflet; normal thickness leaflets. Transvalvular velocity was within the normal range. There was no stenosis. There was no regurgitation. - Mitral valve: Transvalvular velocity was within the normal range. There was no evidence for stenosis. There was no regurgitation. - Right ventricle: The cavity size was normal. Wall thickness was normal. Systolic function was normal. - Tricuspid valve: Structurally normal valve. Transvalvular velocity was within the normal range. - Inferior vena cava: The vessel was small, appearing collapsed, consistent with low central venous pressure. The respirophasic diameter changes were in the normal range (>= 50%), consistent with normal  central venous pressure. - Pericardium, extracardiac: A trivial pericardial effusion was identified. - Small IVC and small left ventricular cavity with an intracavitary gradient suggest underfilling and intravascular volume depletion.    PHYSICAL EXAM General - Well nourished, well developed, in no apparent distress.  Ophthalmologic - Sharp disc margins OU.  Cardiovascular - Regular rate and rhythm with no murmur.  Mental Status -  Level of arousal and orientation to time, place, and person were intact. Language including expression, naming, repetition,  comprehension was assessed and found intact. Fund of Knowledge was assessed and was intact.  Cranial Nerves II - XII - II - Visual field intact OU. III, IV, VI - Extraocular movements intact. V - Facial sensation intact bilaterally. VII - Facial movement intact bilaterally. VIII - Hearing & vestibular intact bilaterally. X - Palate elevates symmetrically. XI - Chin turning & shoulder shrug intact bilaterally. XII - Tongue protrusion intact.  Motor Strength - The patient's strength was normal in all extremities except left hand mild dexterity difficulty and pronator drift was absent.  Bulk was normal and fasciculations were absent.   Motor Tone - Muscle tone was assessed at the neck and appendages and was normal.  Reflexes - The patient's reflexes were 1+ in all extremities and he had no pathological reflexes.  Sensory - Light touch, temperature/pinprick were assessed and were symmetrical.    Coordination - The patient had mild left FTN dysmetria, subtle left HTS dysmetria.  Tremor was absent.  Gait and Station - deferred due to safety concerns.   ASSESSMENT/PLAN Nicholas Buck is a 74 y.o. male with history of HLD and COPD presenting with dizziness and fatigue. He did not receive IV t-PA due to being outside the window.   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 At goal  SCDs for VTE prophylaxis. Pt refusing Diet Heart Room service appropriate?: Yes; Fluid consistency:: Thin  no antithrombotic prior to admission, now on enrolled in PARFAIT trial.  [facility] responsible for trial managmemt. RN to get drug from pharmacy for d.c home   Therapy recommendations:  OP OT, OP PT,  RW w/ 5" wheels  Disposition:  Home with OP therapies Patient has a 10-15% risk of having another stroke over the next year, the highest risk is within 2 weeks of the most recent stroke/TIA (risk of having a stroke following a stroke or TIA is the same). Ongoing risk factor control by Primary Care Physician Follow-up Stroke Clinic at [facility] with Dr. Rosalin Hawking in 2 months, order placed.  Hypertension  Stable Permissive hypertension (OK if < 220/120) but gradually normalize in 5-7 days No BP meds at nome  Hyperlipidemia  Home meds:  lipitor 20, resumed in hospital  LDL 94, goal < 70  Continue statin at discharge  Other Stroke Risk Factors  Advanced age  Former Cigarette smoker, quit smoking 3 years ago   Other Active Problems  [facility] day # 2   Neurology will sign off. Please call with questions. Pt will follow up with Dr. Erlinda Hong at [facility] in about 2 months. Thanks for the consult.  Rosalin Hawking, MD PhD Stroke Neurology 09/09/2015 1:57  PM    To contact Stroke Continuity provider, please refer to [email or web address] After hours, contact General Neurology

## 2015-09-09 NOTE — Progress Notes (Signed)
  Echocardiogram 2D Echocardiogram has been performed.  Nicholas Buck 09/09/2015, 10:09 AM

## 2015-09-09 NOTE — Progress Notes (Signed)
Patient is refusing BG checks. MD made aware CBG < 140. Will continue to monitor. Joaquin Bend E, RN 09/09/2015 12:05 AM

## 2015-09-09 NOTE — Progress Notes (Signed)
Occupational Therapy Treatment Patient Details Name: Nicholas Buck MRN: [id number] DOB: [birth year] Today's Date: 09/09/2015    History of present illness Nicholas Buck is an 74 y.o. male with history of COPD HTN HLD who reports awakening on 10/11 normal. Went to go play golf and noted the onset of dizziness and fatigue at 1030 (LKW). Was off balance but did not fall. Felt that his speech was slurred. 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 Focus of session was LUE [facility] HEP with theraputty, functional transfers, and fall prevention. Spouse present for session and included in education. Session details below. D/c plan remains appropriate.    Follow Up Recommendations  Outpatient OT;Supervision - Intermittent;Other (comment)    Equipment Recommendations  None recommended by OT    Recommendations for Other Services      Precautions / Restrictions Precautions Precautions: Fall Restrictions Weight Bearing Restrictions: No       Mobility Bed Mobility               General bed mobility comments: pt sitting EOB  Transfers Overall transfer level: Needs assistance Equipment used: Rolling walker (2 wheeled) Transfers: Sit to/from Stand Sit to Stand: Min guard         General transfer comment: cues for hand placement. min guard for safety    Balance Overall balance assessment: Needs assistance Sitting-balance support: No upper extremity supported;Feet supported Sitting balance-Leahy Scale: Fair     Standing balance support: Bilateral upper extremity supported;During functional activity Standing balance-Leahy Scale: Fair Standing balance comment: UE support needed for any dynamic movement                   ADL Overall ADL's : Needs assistance/impaired                         Toilet Transfer: Min guard;Ambulation;Comfort height toilet;Grab bars;RW Armed forces technical officer  Details (indicate cue type and reason): Reinforced education on hand placement during toilet transfers at home using sink and rw to stabilize balance.     Tub/ Shower Transfer: Walk-in shower;Min guard;Ambulation;Rolling walker;Shower seat;Grab Paediatric nurse Details (indicate cue type and reason): Reinforced need for something to sit down on to complete shower with pt somewhat more receptive to this today. Spouse present for session and discussed this with her as well. She reports they have a shower seat at home. Educated on the need for someone to be with pt during showering. Functional mobility during ADLs: Min guard;Rolling walker General ADL Comments: Pt noted to have decreased coordination in left hand and left foot. Provided theraputy and HEP for LUE [facility] with pt providing return demonstration. Toilet/simulated shower transfers completed as detailed above. Spouse present for session. Educated on fall prevention.       Vision                     Perception     Praxis      Cognition   Behavior During Therapy: [facility] for tasks assessed/performed Overall Cognitive Status: Within Functional Limits for tasks assessed                       Extremity/Trunk Assessment               Exercises     Shoulder Instructions       General Comments  Pertinent Vitals/ Pain       Pain Assessment: No/denies pain  Home Living                                          Prior Functioning/Environment              Frequency Min 2X/week     Progress Toward Goals  OT Goals(current goals can now be found in the care plan section)  Progress towards OT goals: Progressing toward goals  Acute Rehab OT Goals Patient Stated Goal: To return to active lifestyle OT Goal Formulation: With patient Time For Goal Achievement: 09/15/15 Potential to Achieve Goals: Good ADL Goals Pt Will Perform Grooming: with modified independence;standing Pt  Will Perform Upper Body Bathing: with modified independence;sitting;standing Pt Will Perform Lower Body Bathing: with modified independence;sit to/from stand;with adaptive equipment Pt Will Transfer to Toilet: with modified independence;ambulating;regular height toilet;grab bars Pt Will Perform Toileting - Clothing Manipulation and hygiene: with modified independence;sitting/lateral leans;sit to/from stand Pt Will Perform Tub/Shower Transfer: Tub transfer;with modified independence;ambulating;rolling walker;3 in 1 Pt/caregiver will Perform Home Exercise Program: Increased strength;Left upper extremity;With theraband;With theraputty;With written HEP provided  Plan Discharge plan remains appropriate    Co-evaluation                 End of Session Equipment Utilized During Treatment: Rolling walker   Activity Tolerance Patient tolerated treatment well   Patient Left in bed;with call bell/phone within reach;with family/visitor present   Nurse Communication          Time: 3220-2542 OT Time Calculation (min): 15 min  Charges: OT General Charges $OT Visit: 1 Procedure OT Treatments $Self Care/Home Management : 8-22 mins  Hortencia Pilar 09/09/2015, 1:23 PM

## 2015-09-09 NOTE — Progress Notes (Signed)
Patient is discharged from room 5C19 at this time. Alert and in stable condition. IV site d/c'd as well as tele. Instructions read to patient and understanding verbalized. Left unit via wheelchair with wife and all belongings at side.

## 2015-09-09 NOTE — Care Management Note (Signed)
Case Management Note  Patient Details  Name: FRUTOSO DIMARE MRN: [id number] Date of Birth: 12/08/1940  Subjective/Objective:                    Action/Plan: Patient being discharged today with outpatient OT/PT services and a rolling walker. CM spoke with the patient and his wife and they are interested in the Neurorehabilitation in [place]. Orders sent through [facility] for PT/OT to [facility]. Jermaine with [facility] notified of the order for the rolling walker and will deliver it to the patients room. Patient is aware not to leave without the walker. Bedside RN updated.  Expected Discharge Date:                  Expected Discharge Plan:  Home/Self Care  In-House Referral:     Discharge planning Services     Post Acute Care Choice:    Choice offered to:  Patient  DME Arranged:  Walker rolling DME Agency:  [place]:    [place]:     Status of Service:  Completed, signed off  Medicare Important Message Given:    Date Medicare IM Given:    Medicare IM give by:    Date Additional Medicare IM Given:    Additional Medicare Important Message give by:     If discussed at [place] of Stay Meetings, dates discussed:    Additional Comments:  Pollie Friar, RN 09/09/2015, 12:18 PM

## 2015-09-09 NOTE — Discharge Summary (Signed)
Physician Discharge Summary  Nicholas Buck UTM:[id number] DOB: [birth year] DOA: 09/07/2015  PCP: Scarlette Calico, MD  Admit date: 09/07/2015 Discharge date: 09/09/2015  Time spent: > 35 minutes  Recommendations for Outpatient Follow-up:  1. Patient follow-up with neurology  Discharge Diagnoses:  Principal Problem:   Right pontine stroke [facility]) Active Problems:   COPD GOLD I   Hyperglycemia   Essential hypertension   Stroke (cerebrum) ([place])   Acute embolic stroke [facility])   Discharge Condition: stable  Diet recommendation: heart healthy  Filed Weights   09/08/15 0330  Weight: 83.144 kg (183 lb 4.8 oz)    History of present illness:  74 year old presenting with acute stroke  Hospital Course:  Acute stroke -Patient evaluated by physical therapy, occupational therapy, neurology -Neurology evaluated and recommended the following:  Stroke: 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 At goal  SCDs for VTE prophylaxis. Pt refusing  Diet Heart Room service appropriate?: Yes; Fluid consistency:: Thin  no antithrombotic prior to admission, now on enrolled in PARFAIT trial. [facility] responsible for trial managmemt. RN to get drug from pharmacy for d.c home  Therapy recommendations: OP OT, OP PT, RW w/ 5" wheels  Disposition: Home with OP therapies  Patient has a 10-15% risk of having another stroke over the next year, the highest risk is within 2 weeks of the most recent stroke/TIA (risk of having a stroke following a stroke or TIA is the same).  Ongoing risk factor control by Primary Care Physician  Follow-up Stroke Clinic at [facility] with Dr. Rosalin Hawking in  2 months, order placed.    Procedures: Mr Brain Wo Contrast 09/07/2015 1. Small acute right pontine infarct. 2. Mild-to-moderate chronic small vessel ischemic disease.   Ct Angio Head and neck W/cm &/or Wo Cm 09/08/2015 IMPRESSION: 1. No acute arterial finding to explain acute pontine infarct. 2. Atherosclerosis without cervical or intracranial flow limiting stenosis. 3. Emphysema.   Consultations:  Neurology  Discharge Exam: Filed Vitals:   09/09/15 0545  BP: 166/80  Pulse: 70  Temp: 98.6 F (37 C)  Resp: 18    General: Pt in nad, alert and awake Cardiovascular: rrr, no mrg Respiratory: cta bl, no wheezes  Discharge Instructions   Discharge Instructions    Ambulatory referral to Neurology    Complete by:  As directed   Dr. Erlinda Hong requests followup in 2 months     Call MD for:  difficulty breathing, headache or visual disturbances    Complete by:  As directed      Call MD for:  redness, tenderness, or signs of infection (pain, swelling, redness, odor or green/yellow discharge around incision site)    Complete by:  As directed      Call MD for:  temperature >100.4    Complete by:  As directed      Diet - low sodium heart healthy    Complete by:  As directed      Increase activity slowly    Complete by:  As directed           Current Discharge Medication List    CONTINUE these medications which have NOT CHANGED   Details  atorvastatin (LIPITOR) 20  MG tablet TAKE 1 TABLET BY MOUTH EVERY DAY Qty: 90 tablet, Refills: 3    ferrous sulfate 325 (65 FE) MG EC tablet Take 1 tablet (325 mg total) by mouth 3 (three) times daily with meals. Qty: 90 tablet, Refills: 2    gabapentin (NEURONTIN) 800 MG tablet Take 800 mg by mouth 2 (two) times daily.    traZODone (DESYREL) 100 MG tablet Take 1 tablet (100 mg total) by mouth at bedtime. 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 Mevacor [Lovastatin]     Muscle aches   Follow-up Information    Follow up with Xu,Jindong, MD.   Specialty:  Neurology   Why:  Stroke Clinic, Office will call you with appointment date & time   Contact information:   [address] [place] [address] [phone]        The results of significant diagnostics from this hospitalization (including imaging, microbiology, ancillary and laboratory) are listed below for reference.    Significant Diagnostic Studies: Ct Angio Head W/cm &/or Wo Cm  09/08/2015  CLINICAL DATA:  Stroke. Left facial droop and slurred speech. Infarct in the right pons on MRI. EXAM: CT ANGIOGRAPHY HEAD AND NECK TECHNIQUE: Multidetector CT imaging of the head and neck was performed using the standard protocol during bolus administration of intravenous contrast. Multiplanar CT image reconstructions and MIPs were obtained to evaluate the vascular anatomy. Carotid stenosis measurements (when applicable) are obtained utilizing NASCET criteria, using the distal internal carotid diameter as the denominator. CONTRAST:  63m OMNIPAQUE IOHEXOL 350 MG/ML SOLN COMPARISON:  MRI from yesterday FINDINGS: CT HEAD Brain: Patient's known right pontine infarct subtly seen. No evidence of extension or hemorrhagic conversion. Patchy white matter low density in the bilateral cerebral hemispheres consistent with chronic small vessel disease. Calvarium and skull base: Negative Paranasal sinuses: Negative Orbits: Cataract resections.  No pathologic findings. CTA NECK Aortic arch: Atherosclerosis without visualized aneurysm or dissection. Right carotid system: Atherosclerotic plaque predominantly noncalcified and at the bifurcation. No flow limiting stenosis, ulcerated appearing plaque, or dissection. Left carotid system: Origin not visualized due to coverage. Atherosclerosis,  predominantly at the bifurcation, without flow limiting stenosis, plaque ulceration, or dissection. Vertebral arteries:Bilateral subclavian arteries are widely patent. The dominant right vertebral artery patent to the dura with no notable atherosclerotic change. The non dominant left vertebral artery is widely patent to the dura. No evidence of dissection. Skeleton: Multilevel degenerative disc and facet disease with C6-7 discectomy. No aggressive lytic or blastic lesion. Other neck: Biapical centrilobular emphysema. No incidentally detected mass or adenopathy CTA HEAD Anterior circulation: Symmetric carotid arteries. Scattered predominately calcified atherosclerotic plaque in the carotid siphons without flow limiting stenosis. Symmetric and patent bilateral A1 and M1 segments. No evidence of major branch occlusion. Negative for aneurysm. Posterior circulation: Strong right dominance of the vertebral arteries. The majority left vertebral artery flow is to the left PICA. Faint flow within the left vertebral artery is likely from small vessel size at the limits of resolution. Diminutive basilar artery in the setting a fetal type circulation bilaterally, with most basilar into the symmetric and patent bilateral superior cerebellar arteries. No aneurysm. No indication of dissection. Venous sinuses: Patent Anatomic variants: Bilateral fetal type  PCA. Small anterior communicating artery present. Delayed phase: No abnormal parenchymal enhancement. IMPRESSION: 1. No acute arterial finding to explain acute pontine infarct. 2. Atherosclerosis without cervical or intracranial flow limiting stenosis. 3. Emphysema. Electronically Signed   By: Monte Fantasia M.D.   On: 09/08/2015 15:37   Ct Angio Neck W/cm &/or Wo/cm  09/08/2015  CLINICAL DATA:  Stroke. Left facial droop and slurred speech. Infarct in the right pons on MRI. EXAM: CT ANGIOGRAPHY HEAD AND NECK TECHNIQUE: Multidetector CT imaging of the head and neck was  performed using the standard protocol during bolus administration of intravenous contrast. Multiplanar CT image reconstructions and MIPs were obtained to evaluate the vascular anatomy. Carotid stenosis measurements (when applicable) are obtained utilizing NASCET criteria, using the distal internal carotid diameter as the denominator. CONTRAST:  77m OMNIPAQUE IOHEXOL 350 MG/ML SOLN COMPARISON:  MRI from yesterday FINDINGS: CT HEAD Brain: Patient's known right pontine infarct subtly seen. No evidence of extension or hemorrhagic conversion. Patchy white matter low density in the bilateral cerebral hemispheres consistent with chronic small vessel disease. Calvarium and skull base: Negative Paranasal sinuses: Negative Orbits: Cataract resections.  No pathologic findings. CTA NECK Aortic arch: Atherosclerosis without visualized aneurysm or dissection. Right carotid system: Atherosclerotic plaque predominantly noncalcified and at the bifurcation. No flow limiting stenosis, ulcerated appearing plaque, or dissection. Left carotid system: Origin not visualized due to coverage. Atherosclerosis, predominantly at the bifurcation, without flow limiting stenosis, plaque ulceration, or dissection. Vertebral arteries:Bilateral subclavian arteries are widely patent. The dominant right vertebral artery patent to the dura with no notable atherosclerotic change. The non dominant left vertebral artery is widely patent to the dura. No evidence of dissection. Skeleton: Multilevel degenerative disc and facet disease with C6-7 discectomy. No aggressive lytic or blastic lesion. Other neck: Biapical centrilobular emphysema. No incidentally detected mass or adenopathy CTA HEAD Anterior circulation: Symmetric carotid arteries. Scattered predominately calcified atherosclerotic plaque in the carotid siphons without flow limiting stenosis. Symmetric and patent bilateral A1 and M1 segments. No evidence of major branch occlusion. Negative for  aneurysm. Posterior circulation: Strong right dominance of the vertebral arteries. The majority left vertebral artery flow is to the left PICA. Faint flow within the left vertebral artery is likely from small vessel size at the limits of resolution. Diminutive basilar artery in the setting a fetal type circulation bilaterally, with most basilar into the symmetric and patent bilateral superior cerebellar arteries. No aneurysm. No indication of dissection. Venous sinuses: Patent Anatomic variants: Bilateral fetal type PCA. Small anterior communicating artery present. Delayed phase: No abnormal parenchymal enhancement. IMPRESSION: 1. No acute arterial finding to explain acute pontine infarct. 2. Atherosclerosis without cervical or intracranial flow limiting stenosis. 3. Emphysema. Electronically Signed   By: JMonte FantasiaM.D.   On: 09/08/2015 15:37   Mr Brain Wo Contrast  09/08/2015  CLINICAL DATA:  New onset dizziness and fatigue yesterday, slurred speech not improving today. History of hyperlipidemia, stroke. EXAM: MRI HEAD WITHOUT CONTRAST TECHNIQUE: Multiplanar, multiecho pulse sequences of the brain and surrounding structures were obtained without intravenous contrast. Limited PARFAIT trial protocol. COMPARISON:  MRI of the brain September 07, 2015 and CT head September 08, 2015. FINDINGS: Patchy RIGHT pontine reduced diffusion at site of prior infarct, with corresponding low ADC values mildly expansile associated T2 bright FLAIR signal abnormality. No new areas of reduced diffusion. No susceptibility artifact to suggest hemorrhage. Ventricles and sulci are normal for patient's age. Patchy supratentorial white matter T2 hyperintensities are similar. No midline shift, mass effect or  mass lesions. No abnormal extra-axial fluid collections. Irregular signal within RIGHT internal carotid artery on the T1 though, SWAN sequence though, this is likely artifact and not tailored for evaluation of cerebral vessels. Basal  cisterns are patent. No paranasal sinus air-fluid levels. IMPRESSION: Limited PARFAIT trial protocol. 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 Mild to moderate chronic small vessel ischemic disease, stable. Electronically Signed   By: Elon Alas M.D.   On: 09/08/2015 21:53   Mr Brain Wo Contrast  09/07/2015  CLINICAL DATA:  Dysarthria and left arm clumsiness beginning earlier today. Left facial droop. EXAM: MRI HEAD WITHOUT CONTRAST TECHNIQUE: Multiplanar, multiecho pulse sequences of the brain and surrounding structures were obtained without intravenous contrast. COMPARISON:  None. FINDINGS: There is a small acute infarct in the right pons. There is no evidence of intracranial hemorrhage, mass, midline shift, or extra-axial fluid collection. Foci of T2 hyperintensity in the subcortical and deep cerebral white matter bilaterally are nonspecific but compatible with mild-to-moderate chronic small vessel ischemic disease. A chronic lacunar infarct versus dilated perivascular space is noted in the right lentiform nucleus. There is mild generalized cerebral atrophy. Prior bilateral cataract extraction is noted. Paranasal sinuses and mastoid air cells are clear. Major intracranial vascular flow voids are preserved, with prominent posterior communicating arteries and hypoplastic P1 segments bilaterally. IMPRESSION: 1. Small acute right pontine infarct. 2. Mild-to-moderate chronic small vessel ischemic disease. Electronically Signed   By: Logan Bores M.D.   On: 09/07/2015 21:42    Microbiology: No results found for this or any previous visit (from the past 240 hour(s)).   Labs: Basic Metabolic Panel:  Recent Labs Lab 09/07/15 1940 09/07/15 1951 09/08/15 1754  NA 136 139  --   K 3.9 3.9  --   CL 104 100*  --   CO2 25  --   --   GLUCOSE 107* 104*  --   BUN 11 10  --   CREATININE 0.81 0.90  --   CALCIUM 9.5  --   --   PHOS  --   --  3.5   Liver Function Tests:  Recent Labs Lab  09/07/15 1940  AST 28  ALT 24  ALKPHOS 83  BILITOT 1.2  PROT 7.5  ALBUMIN 4.7   No results for input(s): LIPASE, AMYLASE in the last 168 hours. No results for input(s): AMMONIA in the last 168 hours. CBC:  Recent Labs Lab 09/07/15 1940 09/07/15 1951  WBC 5.8  --   NEUTROABS 3.4  --   HGB 14.2 15.0  HCT 40.6 44.0  MCV 91.9  --   PLT 137*  --    Cardiac Enzymes:  Recent Labs Lab 09/08/15 0422  TROPONINI <0.03   BNP: BNP (last 3 results) No results for input(s): BNP in the last 8760 hours.  ProBNP (last 3 results) No results for input(s): PROBNP in the last 8760 hours.  CBG:  Recent Labs Lab 09/07/15 1918 09/08/15 0351 09/08/15 0735  GLUCAP 108* 137* 104*       Signed:  Velvet Bathe  [facility] 09/09/2015, 11:57 AM

## 2015-09-13 ENCOUNTER — Telehealth: Payer: Self-pay | Admitting: Neurology

## 2015-09-13 ENCOUNTER — Other Ambulatory Visit: Payer: Self-pay | Admitting: Neurology

## 2015-09-13 DIAGNOSIS — I6302 Cerebral infarction due to thrombosis of basilar artery: Secondary | ICD-10-CM

## 2015-09-13 NOTE — Progress Notes (Signed)
MRI ordered. Please call [place] [facility] to schedule. Thanks.  Rosalin Hawking, MD PhD Stroke Neurology 09/13/2015 10:33 AM

## 2015-09-13 NOTE — Telephone Encounter (Signed)
Rn call patient back about appt times for MRI and research study. Rn explain that he is schedule to have mri at 0800am and research study appt at [facility] at 0100pm on 10-04-15. Pt stated a person name Kennyth Lose call him and was confusing him on the times and where to be. Pt stated he was told to have mri and have blood work 3 hrs later. Rn stated she will speak to the MRI schedule to try to resolve the confusing times and place. Rn stated to patient he will get a call back tomorrow.

## 2015-09-13 NOTE — Telephone Encounter (Signed)
Pt called and is requesting that Dr. Leonie Man call him back , he has questions. Pt would only tell me that he wanted a call back . Please call and advise  [phone]

## 2015-09-14 ENCOUNTER — Telehealth: Payer: Self-pay

## 2015-09-14 ENCOUNTER — Telehealth: Payer: Self-pay | Admitting: Neurology

## 2015-09-14 DIAGNOSIS — I6302 Cerebral infarction due to thrombosis of basilar artery: Secondary | ICD-10-CM

## 2015-09-14 NOTE — Telephone Encounter (Signed)
Called and spoke to the subject. He understands his Nov 07, appointment now. No confusion. I will also ask Dr. Erlinda Hong to call patient today to discuss his clinical situation.

## 2015-09-14 NOTE — Addendum Note (Signed)
Addended by: Rosalin Hawking on: 09/14/2015 09:04 PM   Modules accepted: Orders

## 2015-09-14 NOTE — Telephone Encounter (Signed)
Patient inquiring on referrals to physical therapy and occupational therapy from the hospital.  I sent a message to Vivia Ewing and Burnetta Sabin to look into these referrals.

## 2015-09-14 NOTE — Telephone Encounter (Signed)
Rn went to [employer] in research about patient confuse on where to go for the MRI. Nicholas Buck stated that he call patient and spoke to him about that the MRI is schedule at [place] on 10-04-15 at 0800 he needs to be there at 0730am, Also Nicholas Buck spoke with patient to verify that he gets the MRI at 0800am, and come to [facility] and meet with research at 0100pm on the same day. Also patient is schedule to have blood work done at [facility] before the 0100pm appt.

## 2015-09-14 NOTE — Telephone Encounter (Signed)
Called pt and he stated that he is clear now without confusion after Rizwan talked to him this am. He stated that he is getting better with walking, but he still needs PT/OT. He asked why the outpt PT/OT has not called him for appointment. I am not sure about that but I will put in new orders for that.   Rosalin Hawking, MD PhD Stroke Neurology 09/14/2015 9:02 PM  Orders Placed This Encounter  Procedures  . Ambulatory referral to Physical Therapy    Referral Priority:  Routine    Referral Type:  Physical Medicine    Referral Reason:  Specialty Services Required    Requested Specialty:  Physical Therapy    Number of Visits Requested:  1  . Ambulatory referral to Occupational Therapy    Referral Priority:  Routine    Referral Type:  Occupational Therapy    Referral Reason:  Specialty Services Required    Requested Specialty:  Occupational Therapy    Number of Visits Requested:  1

## 2015-09-15 ENCOUNTER — Telehealth: Payer: Self-pay | Admitting: Neurology

## 2015-09-15 ENCOUNTER — Ambulatory Visit (INDEPENDENT_AMBULATORY_CARE_PROVIDER_SITE_OTHER): Payer: Medicare Other | Admitting: Internal Medicine

## 2015-09-15 ENCOUNTER — Encounter: Payer: Self-pay | Admitting: Internal Medicine

## 2015-09-15 VITALS — BP 170/84 | HR 68 | Temp 98.1°F | Resp 16 | Ht 74.0 in | Wt 188.0 lb

## 2015-09-15 DIAGNOSIS — I10 Essential (primary) hypertension: Secondary | ICD-10-CM | POA: Diagnosis not present

## 2015-09-15 DIAGNOSIS — E538 Deficiency of other specified B group vitamins: Secondary | ICD-10-CM | POA: Diagnosis not present

## 2015-09-15 DIAGNOSIS — E785 Hyperlipidemia, unspecified: Secondary | ICD-10-CM

## 2015-09-15 DIAGNOSIS — I6350 Cerebral infarction due to unspecified occlusion or stenosis of unspecified cerebral artery: Secondary | ICD-10-CM

## 2015-09-15 DIAGNOSIS — G63 Polyneuropathy in diseases classified elsewhere: Secondary | ICD-10-CM

## 2015-09-15 MED ORDER — CYANOCOBALAMIN 1000 MCG/ML IJ SOLN
1000.0000 ug | Freq: Once | INTRAMUSCULAR | Status: AC
  Administered 2015-09-15: 1000 ug via INTRAMUSCULAR

## 2015-09-15 MED ORDER — ASPIRIN EC 81 MG PO TBEC: 81.0000 mg | DELAYED_RELEASE_TABLET | Freq: Every day | ORAL | Status: DC

## 2015-09-15 MED ORDER — ASPIRIN EC 81 MG PO TBEC: 81.0000 mg | DELAYED_RELEASE_TABLET | Freq: Every day | ORAL | Status: AC

## 2015-09-15 MED ORDER — AZILSARTAN-CHLORTHALIDONE 40-12.5 MG PO TABS: 1.0000 | ORAL_TABLET | Freq: Every day | ORAL | Status: DC

## 2015-09-15 NOTE — Telephone Encounter (Signed)
Rn call patient back to follow up on previous call. Pt stated he saw his PCP earlier, and the blood pressure issue was taken care of by the PCP. Patient stated he understands the need to be at [facility] outpatient for his MRI on 10-04-15, and [facility] at 0100 for research appt.

## 2015-09-15 NOTE — Telephone Encounter (Signed)
If calls back he needs to call his primary doctor Dr. Scarlette Calico. Pt is seeing the PCP per epic notes. The PCP will have to manage the blood pressure issues. LFt vm for patient that he needs to contact his PCP for blood pressure issues. Rn saw epic note that pt is seeing his PCP today for office visit.

## 2015-09-15 NOTE — Telephone Encounter (Signed)
I spoke to patient and he wants to know why he was not put on medication for high blood pressure. Please call patient. dg

## 2015-09-15 NOTE — Progress Notes (Signed)
Subjective:  Patient ID: Nicholas Buck, male    DOB: 08/10/1941  Age: 74 y.o. MRN: [id number]  CC: Hypertension   HPI Nicholas Buck presents for a f/up on HTN and recent CVA- he sustained a right pontine infarct that caused numbness, tingling, weakness in his LUE and LLE. His symptoms have improved and he tells me the only remaining symptom is improving numbness in his left hand and left foot. His BP is not well controlled though.   Outpatient Prescriptions Prior to Visit  Medication Sig Dispense Refill  . atorvastatin (LIPITOR) 20 MG tablet TAKE 1 TABLET BY MOUTH EVERY DAY 90 tablet 3  . ferrous sulfate 325 (65 FE) MG EC tablet Take 1 tablet (325 mg total) by mouth 3 (three) times daily with meals. (Patient taking differently: Take 325 mg by mouth daily. ) 90 tablet 2  . gabapentin (NEURONTIN) 800 MG tablet Take 800 mg by mouth 2 (two) times daily.    . traZODone (DESYREL) 100 MG tablet Take 1 tablet (100 mg total) by mouth at bedtime. 90 tablet 3   No facility-administered medications prior to visit.    ROS Review of Systems  Constitutional: Negative.  Negative for fever, chills, diaphoresis, appetite change and fatigue.  HENT: Negative.   Eyes: Negative.  Negative for photophobia and visual disturbance.  Respiratory: Negative.  Negative for cough, choking, chest tightness, shortness of breath and stridor.   Cardiovascular: Negative.  Negative for chest pain, palpitations and leg swelling.  Gastrointestinal: Negative.  Negative for nausea, vomiting, abdominal pain, diarrhea, constipation and blood in stool.  Endocrine: Negative.   Genitourinary: Negative.   Musculoskeletal: Negative.  Negative for gait problem.  Skin: Negative.   Allergic/Immunologic: Negative.   Neurological: Positive for numbness. Negative for dizziness, tremors, seizures, syncope, facial asymmetry, light-headedness and headaches.  Hematological: Negative.  Negative for adenopathy. Does not bruise/bleed  easily.  Psychiatric/Behavioral: Negative.     Objective:  BP 170/84 mmHg  Pulse 68  Temp(Src) 98.1 F (36.7 C) (Oral)  Resp 16  Ht '6\' 2"'$  (1.88 m)  Wt 188 lb (85.276 kg)  BMI 24.13 kg/m2  SpO2 97%  BP Readings from Last 3 Encounters:  09/15/15 170/84  09/09/15 166/80  08/17/15 148/84    Wt Readings from Last 3 Encounters:  09/15/15 188 lb (85.276 kg)  09/08/15 183 lb 4.8 oz (83.144 kg)  09/07/15 193 lb (87.544 kg)    Physical Exam  Constitutional: He is oriented to person, place, and time. He appears well-developed and well-nourished. No distress.  HENT:  Head: Normocephalic and atraumatic.  Mouth/Throat: Oropharynx is clear and moist. No oropharyngeal exudate.  Eyes: Conjunctivae are normal. Right eye exhibits no discharge. Left eye exhibits no discharge. No scleral icterus.  Neck: Normal range of motion. Neck supple. No JVD present. No tracheal deviation present. No thyromegaly present.  Cardiovascular: Normal rate, normal heart sounds and intact distal pulses.  Exam reveals no gallop and no friction rub.   No murmur heard. Pulmonary/Chest: Effort normal and breath sounds normal. No stridor. No respiratory distress. He has no wheezes. He has no rales. He exhibits no tenderness.  Abdominal: Soft. Bowel sounds are normal. He exhibits no distension and no mass. There is no tenderness. There is no rebound and no guarding.  Musculoskeletal: Normal range of motion. He exhibits no edema or tenderness.  Lymphadenopathy:    He has no cervical adenopathy.  Neurological: He is oriented to person, place, and time.  Skin: Skin is warm  and dry. No rash noted. He is not diaphoretic. No erythema. No pallor.  Psychiatric: He has a normal mood and affect. His behavior is normal. Judgment and thought content normal.    Lab Results  Component Value Date   WBC 5.8 09/07/2015   HGB 15.0 09/07/2015   HCT 44.0 09/07/2015   PLT 137* 09/07/2015   GLUCOSE 104* 09/07/2015   CHOL 151  09/08/2015   TRIG 108 09/08/2015   HDL 35* 09/08/2015   LDLDIRECT 103.9 07/28/2014   LDLCALC 94 09/08/2015   ALT 24 09/07/2015   AST 28 09/07/2015   NA 139 09/07/2015   K 3.9 09/07/2015   CL 100* 09/07/2015   CREATININE 0.90 09/07/2015   BUN 10 09/07/2015   CO2 25 09/07/2015   TSH 1.72 07/20/2015   INR 1.11 09/08/2015   HGBA1C 5.6 09/08/2015    Ct Angio Head W/cm &/or Wo Cm  09/08/2015  CLINICAL DATA:  Stroke. Left facial droop and slurred speech. Infarct in the right pons on MRI. EXAM: CT ANGIOGRAPHY HEAD AND NECK TECHNIQUE: Multidetector CT imaging of the head and neck was performed using the standard protocol during bolus administration of intravenous contrast. Multiplanar CT image reconstructions and MIPs were obtained to evaluate the vascular anatomy. Carotid stenosis measurements (when applicable) are obtained utilizing NASCET criteria, using the distal internal carotid diameter as the denominator. CONTRAST:  71m OMNIPAQUE IOHEXOL 350 MG/ML SOLN COMPARISON:  MRI from yesterday FINDINGS: CT HEAD Brain: Patient's known right pontine infarct subtly seen. No evidence of extension or hemorrhagic conversion. Patchy white matter low density in the bilateral cerebral hemispheres consistent with chronic small vessel disease. Calvarium and skull base: Negative Paranasal sinuses: Negative Orbits: Cataract resections.  No pathologic findings. CTA NECK Aortic arch: Atherosclerosis without visualized aneurysm or dissection. Right carotid system: Atherosclerotic plaque predominantly noncalcified and at the bifurcation. No flow limiting stenosis, ulcerated appearing plaque, or dissection. Left carotid system: Origin not visualized due to coverage. Atherosclerosis, predominantly at the bifurcation, without flow limiting stenosis, plaque ulceration, or dissection. Vertebral arteries:Bilateral subclavian arteries are widely patent. The dominant right vertebral artery patent to the dura with no notable  atherosclerotic change. The non dominant left vertebral artery is widely patent to the dura. No evidence of dissection. Skeleton: Multilevel degenerative disc and facet disease with C6-7 discectomy. No aggressive lytic or blastic lesion. Other neck: Biapical centrilobular emphysema. No incidentally detected mass or adenopathy CTA HEAD Anterior circulation: Symmetric carotid arteries. Scattered predominately calcified atherosclerotic plaque in the carotid siphons without flow limiting stenosis. Symmetric and patent bilateral A1 and M1 segments. No evidence of major branch occlusion. Negative for aneurysm. Posterior circulation: Strong right dominance of the vertebral arteries. The majority left vertebral artery flow is to the left PICA. Faint flow within the left vertebral artery is likely from small vessel size at the limits of resolution. Diminutive basilar artery in the setting a fetal type circulation bilaterally, with most basilar into the symmetric and patent bilateral superior cerebellar arteries. No aneurysm. No indication of dissection. Venous sinuses: Patent Anatomic variants: Bilateral fetal type PCA. Small anterior communicating artery present. Delayed phase: No abnormal parenchymal enhancement. IMPRESSION: 1. No acute arterial finding to explain acute pontine infarct. 2. Atherosclerosis without cervical or intracranial flow limiting stenosis. 3. Emphysema. Electronically Signed   By: JMonte FantasiaM.D.   On: 09/08/2015 15:37   Ct Angio Neck W/cm &/or Wo/cm  09/08/2015  CLINICAL DATA:  Stroke. Left facial droop and slurred speech. Infarct in the right pons  on MRI. EXAM: CT ANGIOGRAPHY HEAD AND NECK TECHNIQUE: Multidetector CT imaging of the head and neck was performed using the standard protocol during bolus administration of intravenous contrast. Multiplanar CT image reconstructions and MIPs were obtained to evaluate the vascular anatomy. Carotid stenosis measurements (when applicable) are obtained  utilizing NASCET criteria, using the distal internal carotid diameter as the denominator. CONTRAST:  44m OMNIPAQUE IOHEXOL 350 MG/ML SOLN COMPARISON:  MRI from yesterday FINDINGS: CT HEAD Brain: Patient's known right pontine infarct subtly seen. No evidence of extension or hemorrhagic conversion. Patchy white matter low density in the bilateral cerebral hemispheres consistent with chronic small vessel disease. Calvarium and skull base: Negative Paranasal sinuses: Negative Orbits: Cataract resections.  No pathologic findings. CTA NECK Aortic arch: Atherosclerosis without visualized aneurysm or dissection. Right carotid system: Atherosclerotic plaque predominantly noncalcified and at the bifurcation. No flow limiting stenosis, ulcerated appearing plaque, or dissection. Left carotid system: Origin not visualized due to coverage. Atherosclerosis, predominantly at the bifurcation, without flow limiting stenosis, plaque ulceration, or dissection. Vertebral arteries:Bilateral subclavian arteries are widely patent. The dominant right vertebral artery patent to the dura with no notable atherosclerotic change. The non dominant left vertebral artery is widely patent to the dura. No evidence of dissection. Skeleton: Multilevel degenerative disc and facet disease with C6-7 discectomy. No aggressive lytic or blastic lesion. Other neck: Biapical centrilobular emphysema. No incidentally detected mass or adenopathy CTA HEAD Anterior circulation: Symmetric carotid arteries. Scattered predominately calcified atherosclerotic plaque in the carotid siphons without flow limiting stenosis. Symmetric and patent bilateral A1 and M1 segments. No evidence of major branch occlusion. Negative for aneurysm. Posterior circulation: Strong right dominance of the vertebral arteries. The majority left vertebral artery flow is to the left PICA. Faint flow within the left vertebral artery is likely from small vessel size at the limits of resolution.  Diminutive basilar artery in the setting a fetal type circulation bilaterally, with most basilar into the symmetric and patent bilateral superior cerebellar arteries. No aneurysm. No indication of dissection. Venous sinuses: Patent Anatomic variants: Bilateral fetal type PCA. Small anterior communicating artery present. Delayed phase: No abnormal parenchymal enhancement. IMPRESSION: 1. No acute arterial finding to explain acute pontine infarct. 2. Atherosclerosis without cervical or intracranial flow limiting stenosis. 3. Emphysema. Electronically Signed   By: JMonte FantasiaM.D.   On: 09/08/2015 15:37   Mr Brain Wo Contrast  09/08/2015  CLINICAL DATA:  New onset dizziness and fatigue yesterday, slurred speech not improving today. History of hyperlipidemia, stroke. EXAM: MRI HEAD WITHOUT CONTRAST TECHNIQUE: Multiplanar, multiecho pulse sequences of the brain and surrounding structures were obtained without intravenous contrast. Limited PARFAIT trial protocol. COMPARISON:  MRI of the brain September 07, 2015 and CT head September 08, 2015. FINDINGS: Patchy RIGHT pontine reduced diffusion at site of prior infarct, with corresponding low ADC values mildly expansile associated T2 bright FLAIR signal abnormality. No new areas of reduced diffusion. No susceptibility artifact to suggest hemorrhage. Ventricles and sulci are normal for patient's age. Patchy supratentorial white matter T2 hyperintensities are similar. No midline shift, mass effect or mass lesions. No abnormal extra-axial fluid collections. Irregular signal within RIGHT internal carotid artery on the T1 though, SWAN sequence though, this is likely artifact and not tailored for evaluation of cerebral vessels. Basal cisterns are patent. No paranasal sinus air-fluid levels. IMPRESSION: Limited PARFAIT trial protocol. 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 Mild to moderate chronic small vessel ischemic disease, stable. Electronically Signed   By: CThana FarrD.  On: 09/08/2015 21:53   Mr Brain Wo Contrast  09/07/2015  CLINICAL DATA:  Dysarthria and left arm clumsiness beginning earlier today. Left facial droop. EXAM: MRI HEAD WITHOUT CONTRAST TECHNIQUE: Multiplanar, multiecho pulse sequences of the brain and surrounding structures were obtained without intravenous contrast. COMPARISON:  None. FINDINGS: There is a small acute infarct in the right pons. There is no evidence of intracranial hemorrhage, mass, midline shift, or extra-axial fluid collection. Foci of T2 hyperintensity in the subcortical and deep cerebral white matter bilaterally are nonspecific but compatible with mild-to-moderate chronic small vessel ischemic disease. A chronic lacunar infarct versus dilated perivascular space is noted in the right lentiform nucleus. There is mild generalized cerebral atrophy. Prior bilateral cataract extraction is noted. Paranasal sinuses and mastoid air cells are clear. Major intracranial vascular flow voids are preserved, with prominent posterior communicating arteries and hypoplastic P1 segments bilaterally. IMPRESSION: 1. Small acute right pontine infarct. 2. Mild-to-moderate chronic small vessel ischemic disease. Electronically Signed   By: Logan Bores M.D.   On: 09/07/2015 21:42    Assessment & Plan:   Koltyn was seen today for hypertension.  Diagnoses and all orders for this visit:  Right pontine stroke [facility])- his symptoms are improving, his exam is normal, will treat his elevated BP, will cont asa and statin, he will f/up with neurology  Essential hypertension- his BP is not well controlled, will treat with ARB/thiazide combination -     Azilsartan-Chlorthalidone (EDARBYCLOR) 40-12.5 MG TABS; Take 1 tablet by mouth daily.  Dyslipidemia, goal LDL below 70- he is doing well on the statin   I am having Mr. Richens start on Azilsartan-Chlorthalidone. I am also having him maintain his ferrous sulfate, traZODone, atorvastatin, and  gabapentin.  Meds ordered this encounter  Medications  . Azilsartan-Chlorthalidone (EDARBYCLOR) 40-12.5 MG TABS    Sig: Take 1 tablet by mouth daily.    Dispense:  30 tablet    Refill:  11     Follow-up: No Follow-up on file.  Scarlette Calico, MD

## 2015-09-15 NOTE — Progress Notes (Signed)
Pre visit review using our clinic review tool, if applicable. No additional management support is needed unless otherwise documented below in the visit note. 

## 2015-09-15 NOTE — Patient Instructions (Signed)
Hypertension Hypertension, commonly called high blood pressure, is when the force of blood pumping through your arteries is too strong. Your arteries are the blood vessels that carry blood from your heart throughout your body. A blood pressure reading consists of a higher number over a lower number, such as 110/72. The higher number (systolic) is the pressure inside your arteries when your heart pumps. The lower number (diastolic) is the pressure inside your arteries when your heart relaxes. Ideally you want your blood pressure below 120/80. Hypertension forces your heart to work harder to pump blood. Your arteries may become narrow or stiff. Having untreated or uncontrolled hypertension can cause heart attack, stroke, kidney disease, and other problems. RISK FACTORS Some risk factors for high blood pressure are controllable. Others are not.  Risk factors you cannot control include:   Race. You may be at higher risk if you are African American.  Age. Risk increases with age.  Gender. Men are at higher risk than women before age 45 years. After age 65, women are at higher risk than men. Risk factors you can control include:  Not getting enough exercise or physical activity.  Being overweight.  Getting too much fat, sugar, calories, or salt in your diet.  Drinking too much alcohol. SIGNS AND SYMPTOMS Hypertension does not usually cause signs or symptoms. Extremely high blood pressure (hypertensive crisis) may cause headache, anxiety, shortness of breath, and nosebleed. DIAGNOSIS To check if you have hypertension, your health care provider will measure your blood pressure while you are seated, with your arm held at the level of your heart. It should be measured at least twice using the same arm. Certain conditions can cause a difference in blood pressure between your right and left arms. A blood pressure reading that is higher than normal on one occasion does not mean that you need treatment. If  it is not clear whether you have high blood pressure, you may be asked to return on a different day to have your blood pressure checked again. Or, you may be asked to monitor your blood pressure at home for 1 or more weeks. TREATMENT Treating high blood pressure includes making lifestyle changes and possibly taking medicine. Living a healthy lifestyle can help lower high blood pressure. You may need to change some of your habits. Lifestyle changes may include:  Following the DASH diet. This diet is high in fruits, vegetables, and whole grains. It is low in salt, red meat, and added sugars.  Keep your sodium intake below 2,300 mg per day.  Getting at least 30-45 minutes of aerobic exercise at least 4 times per week.  Losing weight if necessary.  Not smoking.  Limiting alcoholic beverages.  Learning ways to reduce stress. Your health care provider may prescribe medicine if lifestyle changes are not enough to get your blood pressure under control, and if one of the following is true:  You are 18-59 years of age and your systolic blood pressure is above 140.  You are 60 years of age or older, and your systolic blood pressure is above 150.  Your diastolic blood pressure is above 90.  You have diabetes, and your systolic blood pressure is over 140 or your diastolic blood pressure is over 90.  You have kidney disease and your blood pressure is above 140/90.  You have heart disease and your blood pressure is above 140/90. Your personal target blood pressure may vary depending on your medical conditions, your age, and other factors. HOME CARE INSTRUCTIONS    Have your blood pressure rechecked as directed by your health care provider.   Take medicines only as directed by your health care provider. Follow the directions carefully. Blood pressure medicines must be taken as prescribed. The medicine does not work as well when you skip doses. Skipping doses also puts you at risk for  problems.  Do not smoke.   Monitor your blood pressure at home as directed by your health care provider. SEEK MEDICAL CARE IF:   You think you are having a reaction to medicines taken.  You have recurrent headaches or feel dizzy.  You have swelling in your ankles.  You have trouble with your vision. SEEK IMMEDIATE MEDICAL CARE IF:  You develop a severe headache or confusion.  You have unusual weakness, numbness, or feel faint.  You have severe chest or abdominal pain.  You vomit repeatedly.  You have trouble breathing. MAKE SURE YOU:   Understand these instructions.  Will watch your condition.  Will get help right away if you are not doing well or get worse.   This information is not intended to replace advice given to you by your health care provider. Make sure you discuss any questions you have with your health care provider.   Document Released: 11/13/2005 Document Revised: 03/30/2015 Document Reviewed: 09/05/2013 Elsevier Interactive Patient Education 2016 [employer]  

## 2015-09-16 ENCOUNTER — Ambulatory Visit: Payer: Medicare Other

## 2015-09-22 NOTE — Progress Notes (Deleted)
Subjective:    Patient ID: Nicholas Buck is a 74 y.o. male.  HPI {Common ambulatory SmartLinks:19316}  Review of Systems  Objective:  Neurologic Exam  Physical Exam  Assessment:   ***  Plan:   ***

## 2015-09-22 NOTE — Progress Notes (Signed)
Patient had orders for physical therapy and occupational therapy. Pt refuse both services per appointment notes.

## 2015-10-01 ENCOUNTER — Telehealth: Payer: Self-pay | Admitting: Neurology

## 2015-10-01 NOTE — Telephone Encounter (Signed)
I left a message for Nicholas Buck reminding him of research appointment for Nov. 7th as well as his MRI appointment.  The patient was instructed not to take his study medication for Nov. 7th.  I left my direct phone number in the event that the patient had any questions.

## 2015-10-04 ENCOUNTER — Ambulatory Visit (INDEPENDENT_AMBULATORY_CARE_PROVIDER_SITE_OTHER): Payer: Self-pay | Admitting: Neurology

## 2015-10-04 ENCOUNTER — Ambulatory Visit (HOSPITAL_COMMUNITY)
Admission: RE | Admit: 2015-10-04 | Discharge: 2015-10-04 | Disposition: A | Discharge status: Home / Self Care | Payer: Medicare Other | Source: Ambulatory Visit | Attending: Neurology | Admitting: Neurology

## 2015-10-04 ENCOUNTER — Other Ambulatory Visit (INDEPENDENT_AMBULATORY_CARE_PROVIDER_SITE_OTHER): Payer: Self-pay

## 2015-10-04 DIAGNOSIS — E785 Hyperlipidemia, unspecified: Secondary | ICD-10-CM | POA: Insufficient documentation

## 2015-10-04 DIAGNOSIS — Z0289 Encounter for other administrative examinations: Secondary | ICD-10-CM

## 2015-10-04 DIAGNOSIS — I6381 Other cerebral infarction due to occlusion or stenosis of small artery: Secondary | ICD-10-CM

## 2015-10-04 DIAGNOSIS — I6302 Cerebral infarction due to thrombosis of basilar artery: Secondary | ICD-10-CM | POA: Diagnosis not present

## 2015-10-04 DIAGNOSIS — I638 Other cerebral infarction: Secondary | ICD-10-CM | POA: Diagnosis present

## 2015-10-04 DIAGNOSIS — I639 Cerebral infarction, unspecified: Secondary | ICD-10-CM

## 2015-10-04 NOTE — Progress Notes (Signed)
PARFAIT TRIAL28 day visit  He is seen today for follow-up for the study related 28 day visit. He is doing well and has recovered quite well. He has occasional mild incoordination of his left hand and feels that his stamina is not back to normal but he has no physical remaining deficits. He has tolerated study medication and aspirin well without side effects. He states his blood pressure is under good control.  PHYSICAL EXAM :  . Afebrile. Head is nontraumatic. Neck is supple without bruit.    Cardiac exam no murmur or gallop. Lungs are clear to auscultation. Distal pulses are well felt.  Neurological Exam ;  Awake  Alert oriented x 3. Normal speech and language.eye movements full without nystagmus.fundi were not visualized. Vision acuity and fields appear normal. Hearing is normal. Palatal movements are normal. Face symmetric. Tongue midline. Normal strength, tone, reflexes and coordination. Normal sensation. Gait deferred.  NIHSS 0. Modified Rankin score 1  IMPRESSION : 71 her Caucasian male with small right pontine paramedian infarct in October 2016 from small vessel disease. Vascular risk factors of hypertension, hyperlipidemia and marijuana usage. Patient has participated in the Parfait  stroke trial Plan : He will discontinue the study medication now and change to aspirin 325 mg daily for secondary stroke prevention. I counseled him to quit marijuana usage and to maintain strict control of hypertension with blood pressure goal 130/90 and lipids with LDL cholesterol goal below 70 mg percent. He was counseled to stay on his blood pressure and cholesterol medications and follow-up with her medical doctor for the same. He was advised to follow-up for study 90 day visit as scheduled.  Antony Contras, MD

## 2015-10-05 LAB — URINALYSIS, ROUTINE W REFLEX MICROSCOPIC
Bilirubin, UA: NEGATIVE
Glucose, UA: NEGATIVE
KETONES UA: NEGATIVE
LEUKOCYTES UA: NEGATIVE
NITRITE UA: NEGATIVE
Protein, UA: NEGATIVE
RBC, UA: NEGATIVE
SPEC GRAV UA: 1.014 (ref 1.005–1.030)
Urobilinogen, Ur: 0.2 mg/dL (ref 0.2–1.0)
pH, UA: 7 (ref 5.0–7.5)

## 2015-10-20 ENCOUNTER — Encounter: Payer: Self-pay | Admitting: Internal Medicine

## 2015-10-20 ENCOUNTER — Ambulatory Visit (INDEPENDENT_AMBULATORY_CARE_PROVIDER_SITE_OTHER): Payer: Medicare Other | Admitting: Internal Medicine

## 2015-10-20 VITALS — BP 122/78 | HR 72 | Temp 97.8°F | Resp 16 | Ht 74.0 in | Wt 196.0 lb

## 2015-10-20 DIAGNOSIS — E538 Deficiency of other specified B group vitamins: Secondary | ICD-10-CM | POA: Diagnosis not present

## 2015-10-20 DIAGNOSIS — I10 Essential (primary) hypertension: Secondary | ICD-10-CM

## 2015-10-20 DIAGNOSIS — I6350 Cerebral infarction due to unspecified occlusion or stenosis of unspecified cerebral artery: Secondary | ICD-10-CM | POA: Diagnosis not present

## 2015-10-20 DIAGNOSIS — G63 Polyneuropathy in diseases classified elsewhere: Principal | ICD-10-CM

## 2015-10-20 MED ORDER — CYANOCOBALAMIN 1000 MCG/ML IJ SOLN
1000.0000 ug | Freq: Once | INTRAMUSCULAR | Status: AC
  Administered 2015-10-20: 1000 ug via INTRAMUSCULAR

## 2015-10-20 NOTE — Progress Notes (Signed)
Pre visit review using our clinic review tool, if applicable. No additional management support is needed unless otherwise documented below in the visit note. 

## 2015-10-20 NOTE — Progress Notes (Signed)
Subjective:  Patient ID: Nicholas Buck, male    DOB: March 19, 1941  Age: 74 y.o. MRN: [id number]  CC: Hypertension   HPI Nicholas Buck presents for a BP check - he feels well and offers no complaints.  Outpatient Prescriptions Prior to Visit  Medication Sig Dispense Refill  . aspirin EC 81 MG tablet Take 1 tablet (81 mg total) by mouth daily. 30 tablet 11  . atorvastatin (LIPITOR) 20 MG tablet TAKE 1 TABLET BY MOUTH EVERY DAY 90 tablet 3  . Azilsartan-Chlorthalidone (EDARBYCLOR) 40-12.5 MG TABS Take 1 tablet by mouth daily. 30 tablet 11  . ferrous sulfate 325 (65 FE) MG EC tablet Take 1 tablet (325 mg total) by mouth 3 (three) times daily with meals. (Patient taking differently: Take 325 mg by mouth daily. ) 90 tablet 2  . gabapentin (NEURONTIN) 800 MG tablet Take 800 mg by mouth 2 (two) times daily.    . traZODone (DESYREL) 100 MG tablet Take 1 tablet (100 mg total) by mouth at bedtime. 90 tablet 3   No facility-administered medications prior to visit.    ROS Review of Systems  Constitutional: Negative.  Negative for fever, chills, diaphoresis, appetite change and fatigue.  HENT: Negative.   Eyes: Negative.   Respiratory: Negative.  Negative for cough, choking, chest tightness, shortness of breath and stridor.   Cardiovascular: Negative.  Negative for chest pain, palpitations and leg swelling.  Gastrointestinal: Negative.  Negative for nausea, vomiting, abdominal pain, diarrhea and constipation.  Endocrine: Negative.   Genitourinary: Negative.   Musculoskeletal: Negative.   Skin: Negative.   Allergic/Immunologic: Negative.   Neurological: Negative.  Negative for dizziness, tremors, weakness, light-headedness, numbness and headaches.  Hematological: Negative.  Negative for adenopathy. Does not bruise/bleed easily.  Psychiatric/Behavioral: Negative.     Objective:  BP 122/78 mmHg  Pulse 72  Temp(Src) 97.8 F (36.6 C) (Oral)  Resp 16  Ht '6\' 2"'$  (1.88 m)  Wt 196 lb (88.905  kg)  BMI 25.15 kg/m2  SpO2 97%  BP Readings from Last 3 Encounters:  10/20/15 122/78  09/15/15 170/84  09/09/15 166/80    Wt Readings from Last 3 Encounters:  10/20/15 196 lb (88.905 kg)  09/15/15 188 lb (85.276 kg)  09/08/15 183 lb 4.8 oz (83.144 kg)    Physical Exam  Constitutional: He is oriented to person, place, and time. No distress.  HENT:  Mouth/Throat: Oropharynx is clear and moist. No oropharyngeal exudate.  Eyes: Conjunctivae are normal. Right eye exhibits no discharge. Left eye exhibits no discharge. No scleral icterus.  Neck: Normal range of motion. Neck supple. No JVD present. No tracheal deviation present. No thyromegaly present.  Cardiovascular: Normal rate, regular rhythm, normal heart sounds and intact distal pulses.  Exam reveals no gallop and no friction rub.   No murmur heard. Pulmonary/Chest: Effort normal and breath sounds normal. No stridor. No respiratory distress. He has no wheezes. He has no rales. He exhibits no tenderness.  Abdominal: Soft. Bowel sounds are normal. He exhibits no distension and no mass. There is no tenderness. There is no rebound and no guarding.  Musculoskeletal: Normal range of motion. He exhibits no edema or tenderness.  Lymphadenopathy:    He has no cervical adenopathy.  Neurological: He is oriented to person, place, and time.  Skin: Skin is warm and dry. No rash noted. He is not diaphoretic. No erythema. No pallor.  Vitals reviewed.   Lab Results  Component Value Date   WBC 5.8 09/07/2015  HGB 15.0 09/07/2015   HCT 44.0 09/07/2015   PLT 137* 09/07/2015   GLUCOSE 104* 09/07/2015   CHOL 151 09/08/2015   TRIG 108 09/08/2015   HDL 35* 09/08/2015   LDLDIRECT 103.9 07/28/2014   LDLCALC 94 09/08/2015   ALT 24 09/07/2015   AST 28 09/07/2015   NA 139 09/07/2015   K 3.9 09/07/2015   CL 100* 09/07/2015   CREATININE 0.90 09/07/2015   BUN 10 09/07/2015   CO2 25 09/07/2015   TSH 1.72 07/20/2015   INR 1.11 09/08/2015    HGBA1C 5.6 09/08/2015    Mr Brain Wo Contrast  10/04/2015  CLINICAL DATA:  74 year old male with hyperlipidemia. Recent pontine infarct. Subsequent encounter. Parfait trial protocol. EXAM: MRI HEAD WITHOUT CONTRAST TECHNIQUE: Multiplanar, multiecho pulse sequences of the brain and surrounding structures were obtained without intravenous contrast. COMPARISON:  09/08/2015 and 09/07/2015 MR. FINDINGS: Subacute nonhemorrhagic right paracentral pontine infarct (tiny region of altered signal intensity within the infarct on gradient sequence appears linear and may represent a small vessel rather than hemorrhage). Very small acute right parietal lobe nonhemorrhagic infarct. Moderate small vessel disease type changes. No hydrocephalus. No intracranial mass lesion noted on this unenhanced exam. Major intracranial vascular structures are patent. IMPRESSION: Parfait trial protocol exam. Subacute nonhemorrhagic right paracentral pontine infarct (tiny region of altered signal intensity within the infarct on gradient sequence appears linear and may represent a small vessel rather than hemorrhage). Very small acute right parietal lobe nonhemorrhagic infarct. Electronically Signed   By: Genia Del M.D.   On: 10/04/2015 09:20    Assessment & Plan:   Pj was seen today for hypertension.  Diagnoses and all orders for this visit:  Vitamin B12 deficiency neuropathy -     cyanocobalamin ((VITAMIN B-12)) injection 1,000 mcg; Inject 1 mL (1,000 mcg total) into the muscle once.  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 Will cont risk factor modifications with BP control, asa, and statin   I am having Mr. Brickle maintain his ferrous sulfate, traZODone, atorvastatin, gabapentin, Azilsartan-Chlorthalidone, aspirin EC, and tiZANidine. We administered cyanocobalamin.  Meds ordered this encounter  Medications  .  tiZANidine (ZANAFLEX) 2 MG tablet    Sig:   . cyanocobalamin ((VITAMIN B-12)) injection 1,000 mcg    Sig:      Follow-up: No Follow-up on file.  Scarlette Calico, MD

## 2015-11-02 ENCOUNTER — Encounter: Payer: Self-pay | Admitting: Neurology

## 2015-11-02 ENCOUNTER — Telehealth: Payer: Self-pay | Admitting: Neurology

## 2015-11-02 NOTE — Telephone Encounter (Signed)
I spoke with Lauralee Evener.  I let him know that I scheduled his appointment 1 month too early and that I needed to reschedule him.  He asked that I double check that because he has received 3 phone calls letting him know of his appointment.  I verified with Darrick Meigs that indeed, he needed to come a month later.  I rescheduled him to January 9th for his research appointment.  Lauralee Evener requested that he have something in writing with regards to his new appointment.  I told him that I would have a letter out to him today.

## 2015-11-03 ENCOUNTER — Encounter: Payer: Medicare Other | Admitting: Neurology

## 2015-11-09 ENCOUNTER — Encounter: Payer: Self-pay | Admitting: Neurology

## 2015-11-09 ENCOUNTER — Ambulatory Visit (INDEPENDENT_AMBULATORY_CARE_PROVIDER_SITE_OTHER): Payer: Medicare Other | Admitting: Neurology

## 2015-11-09 VITALS — BP 103/60 | HR 75 | Ht 73.0 in | Wt 200.8 lb

## 2015-11-09 DIAGNOSIS — I6302 Cerebral infarction due to thrombosis of basilar artery: Secondary | ICD-10-CM | POA: Insufficient documentation

## 2015-11-09 DIAGNOSIS — E785 Hyperlipidemia, unspecified: Secondary | ICD-10-CM | POA: Insufficient documentation

## 2015-11-09 DIAGNOSIS — I10 Essential (primary) hypertension: Secondary | ICD-10-CM

## 2015-11-09 NOTE — Patient Instructions (Addendum)
-   continue ASA and lipitor for stroke prevention - Follow up with your primary care physician for stroke risk factor modification. Recommend maintain blood pressure goal <130/80, diabetes with hemoglobin A1c goal below 6.5% and lipids with LDL cholesterol goal below 70 mg/dL.  - check BP at home daily and record and bring over to PCP for medication adjustment if needed. - healthy diet and regular home exercise. - follow up in 6 months.

## 2015-11-09 NOTE — Progress Notes (Signed)
STROKE NEUROLOGY FOLLOW UP NOTE  NAME: Nicholas Buck DOB: 1941/08/12  REASON FOR VISIT: stroke follow up HISTORY FROM: pt and chart  Today we had the pleasure of seeing Nicholas Buck in follow-up at our Neurology Clinic. Pt was accompanied by no one.   History Summary Nicholas Buck is a 74 y.o. male with history of HLD and COPD was admitted on 09/07/15 for dizziness and fatigue. MRI showed small right pontine infarct. CTA head and neck no significant stenosis but diminutive BA in the setting a fetal type circulation bilaterally. LDL 94 and A1C 5.6. TTE showed EF 60-65%. He was enrolled to [facility] trial during admission and continued lipitor. He was discharged in good condition.  Interval History During the interval time, the patient has been doing well. Completed PARFAIT trial without side effects. Stated that he still has sour at left knee which he had surgery on when he was young. He still has slight dragging at left foot on walking, felt unsteady but no fall. BP 103/60 in clinic. He is taking azilsartan-chlorithalidone now for BP.     REVIEW OF SYSTEMS: Full 14 system review of systems performed and notable only for those listed below and in HPI above, all others are negative:  Constitutional:   Cardiovascular:  Ear/Nose/Throat:   Skin:  Eyes:   Respiratory:   Gastroitestinal:   Genitourinary:  Hematology/Lymphatic:   Endocrine:  Musculoskeletal:   Allergy/Immunology:   Neurological:   Psychiatric:  Sleep: snoring  The following represents the patient's updated allergies and side effects list: Allergies  Allergen Reactions  . Mevacor [Lovastatin]     Muscle aches    The neurologically relevant items on the patient's problem list were reviewed on today's visit.  Neurologic Examination  A problem focused neurological exam (12 or more points of the single system neurologic examination, vital signs counts as 1 point, cranial nerves count for 8 points) was  performed.  Blood pressure 103/60, pulse 75, height '6\' 1"'$  (1.854 m), weight 200 lb 12.8 oz (91.082 kg).  General - Well nourished, well developed, in no apparent distress.  Ophthalmologic - Sharp disc margins OU.   Cardiovascular - Regular rate and rhythm with no murmur.  Mental Status -  Level of arousal and orientation to time, place, and person were intact. Language including expression, naming, repetition, comprehension was assessed and found intact. Fund of Knowledge was assessed and was intact.  Cranial Nerves II - XII - II - Visual field intact OU. III, IV, VI - Extraocular movements intact. V - Facial sensation intact bilaterally. VII - Facial movement intact bilaterally. VIII - Hearing & vestibular intact bilaterally. X - Palate elevates symmetrically. XI - Chin turning & shoulder shrug intact bilaterally. XII - Tongue protrusion intact.  Motor Strength - The patient's strength was normal in all extremities and pronator drift was absent.  Bulk was normal and fasciculations were absent.   Motor Tone - Muscle tone was assessed at the neck and appendages and was normal.  Reflexes - The patient's reflexes were 1+ in all extremities and he had no pathological reflexes.  Sensory - Light touch, temperature/pinprick, vibration and proprioception, and Romberg testing were assessed and were normal.    Coordination - The patient had normal movements in the hands and feet with no ataxia or dysmetria.  Tremor was absent.  Gait and Station - The patient's transfers, posture, gait, station, and turns were observed as normal.  Data reviewed: I personally reviewed the images  and agree with the radiology interpretations.  Mr Brain Wo Contrast 09/07/2015 1. Small acute right pontine infarct. 2. Mild-to-moderate chronic small vessel ischemic disease.   Ct Angio Head and neck W/cm &/or Wo Cm 09/08/2015 IMPRESSION: 1. No acute arterial finding to explain acute pontine infarct. 2.  Atherosclerosis without cervical or intracranial flow limiting stenosis. 3. Emphysema.   2D echo - Left ventricle: Intracavitary gradient with a peak velocity of 2.3 m/sec. Peak systolic pressure 16 mmHg. The cavity size was at the lower limits of normal. There was mild concentric hypertrophy. Systolic function was normal. The estimated ejection fraction was in the range of 60% to 65%. Wall motion was normal; there were no regional wall motion abnormalities. Doppler parameters are consistent with abnormal left ventricular relaxation (grade 1 diastolic dysfunction). - Aortic valve: Probably trileaflet; normal thickness leaflets. Transvalvular velocity was within the normal range. There was no stenosis. There was no regurgitation. - Mitral valve: Transvalvular velocity was within the normal range. There was no evidence for stenosis. There was no regurgitation. - Right ventricle: The cavity size was normal. Wall thickness was normal. Systolic function was normal. - Tricuspid valve: Structurally normal valve. Transvalvular velocity was within the normal range. - Inferior vena cava: The vessel was small, appearing collapsed, consistent with low central venous pressure. The respirophasic diameter changes were in the normal range (>= 50%), consistent with normal central venous pressure. - Pericardium, extracardiac: A trivial pericardial effusion was identified. - Small IVC and small left ventricular cavity with an intracavitary gradient suggest underfilling and intravascular volume depletion.  Component     Latest Ref Rng 09/08/2015  Cholesterol     0 - 200 mg/dL 151  Triglycerides     <150 mg/dL 108  HDL Cholesterol     >40 mg/dL 35 (L)  Total CHOL/HDL Ratio      4.3  VLDL     0 - 40 mg/dL 22  LDL (calc)     0 - 99 mg/dL 94  Hemoglobin A1C     4.8 - 5.6 % 5.6  Mean Plasma Glucose      114    Assessment: As you may recall, he is a 74 y.o.  Caucasian male with PMH of HLD and COPD was admitted on 09/07/15 for small right pontine infarct. CTA head and neck no significant stenosis but diminutive BA in the setting a fetal type circulation bilaterally. LDL 94 and A1C 5.6. TTE EF 60-65%. He was enrolled to [facility] trial during admission and continued lipitor. He completed one month PARFAIT trail without side effects. Pt has been doing well with mild complain left knee sour and mild unsteady of left foot, but exam not revealing.  Plan:  - continue ASA and lipitor for stroke prevention - Follow up with your primary care physician for stroke risk factor modification. Recommend maintain blood pressure goal <130/80, diabetes with hemoglobin A1c goal below 6.5% and lipids with LDL cholesterol goal below 70 mg/dL.  - check BP at home daily and record and bring over to PCP for medication adjustment if needed. - healthy diet and regular home exercise. - follow up in 6 months.  I spent more than 25 minutes of face to face time with the patient. Greater than 50% of time was spent in counseling and coordination of care.   No orders of the defined types were placed in this encounter.    Meds ordered this encounter  Medications  . ferrous sulfate 325 (65 FE) MG tablet  Sig: Take 325 mg by mouth daily with breakfast.    Patient Instructions  - continue ASA and lipitor for stroke prevention - Follow up with your primary care physician for stroke risk factor modification. Recommend maintain blood pressure goal <130/80, diabetes with hemoglobin A1c goal below 6.5% and lipids with LDL cholesterol goal below 70 mg/dL.  - check BP at home daily and record and bring over to PCP for medication adjustment if needed. - healthy diet and regular home exercise. - follow up in 6 months.    Rosalin Hawking, MD PhD [facility] [address] [place], [place] [address] [phone]  .j

## 2015-11-23 ENCOUNTER — Ambulatory Visit (INDEPENDENT_AMBULATORY_CARE_PROVIDER_SITE_OTHER): Payer: Medicare Other

## 2015-11-23 DIAGNOSIS — Z7689 Persons encountering health services in other specified circumstances: Secondary | ICD-10-CM

## 2015-11-23 DIAGNOSIS — E538 Deficiency of other specified B group vitamins: Secondary | ICD-10-CM

## 2015-11-23 MED ORDER — CYANOCOBALAMIN 1000 MCG/ML IJ SOLN
1000.0000 ug | Freq: Once | INTRAMUSCULAR | Status: AC
  Administered 2015-11-23: 1000 ug via INTRAMUSCULAR

## 2015-12-06 ENCOUNTER — Encounter: Payer: Medicare Other | Admitting: Neurology

## 2015-12-20 ENCOUNTER — Ambulatory Visit: Payer: Medicare Other | Admitting: Internal Medicine

## 2015-12-24 ENCOUNTER — Ambulatory Visit (INDEPENDENT_AMBULATORY_CARE_PROVIDER_SITE_OTHER): Payer: Medicare Other

## 2015-12-24 ENCOUNTER — Ambulatory Visit: Payer: Medicare Other

## 2015-12-24 DIAGNOSIS — E538 Deficiency of other specified B group vitamins: Secondary | ICD-10-CM

## 2015-12-24 MED ORDER — CYANOCOBALAMIN 1000 MCG/ML IJ SOLN
1000.0000 ug | Freq: Once | INTRAMUSCULAR | Status: AC
  Administered 2015-12-24: 1000 ug via INTRAMUSCULAR

## 2016-01-24 ENCOUNTER — Ambulatory Visit (INDEPENDENT_AMBULATORY_CARE_PROVIDER_SITE_OTHER): Payer: Medicare Other

## 2016-01-24 DIAGNOSIS — E538 Deficiency of other specified B group vitamins: Secondary | ICD-10-CM

## 2016-01-24 MED ORDER — CYANOCOBALAMIN 1000 MCG/ML IJ SOLN
1000.0000 ug | Freq: Once | INTRAMUSCULAR | Status: AC
  Administered 2016-01-24: 1000 ug via INTRAMUSCULAR

## 2016-02-21 ENCOUNTER — Ambulatory Visit (INDEPENDENT_AMBULATORY_CARE_PROVIDER_SITE_OTHER): Payer: Medicare Other

## 2016-02-21 DIAGNOSIS — E538 Deficiency of other specified B group vitamins: Secondary | ICD-10-CM | POA: Diagnosis not present

## 2016-02-21 MED ORDER — CYANOCOBALAMIN 1000 MCG/ML IJ SOLN
1000.0000 ug | Freq: Once | INTRAMUSCULAR | Status: AC
  Administered 2016-02-21: 1000 ug via INTRAMUSCULAR

## 2016-02-24 ENCOUNTER — Ambulatory Visit (INDEPENDENT_AMBULATORY_CARE_PROVIDER_SITE_OTHER): Payer: Medicare Other | Admitting: Internal Medicine

## 2016-02-24 ENCOUNTER — Other Ambulatory Visit (INDEPENDENT_AMBULATORY_CARE_PROVIDER_SITE_OTHER): Payer: Medicare Other

## 2016-02-24 ENCOUNTER — Encounter: Payer: Self-pay | Admitting: Internal Medicine

## 2016-02-24 VITALS — BP 108/68 | HR 81 | Temp 98.2°F | Resp 16 | Ht 73.0 in | Wt 203.0 lb

## 2016-02-24 DIAGNOSIS — I6350 Cerebral infarction due to unspecified occlusion or stenosis of unspecified cerebral artery: Secondary | ICD-10-CM

## 2016-02-24 DIAGNOSIS — R0609 Other forms of dyspnea: Secondary | ICD-10-CM

## 2016-02-24 DIAGNOSIS — I10 Essential (primary) hypertension: Secondary | ICD-10-CM

## 2016-02-24 DIAGNOSIS — I6302 Cerebral infarction due to thrombosis of basilar artery: Secondary | ICD-10-CM

## 2016-02-24 DIAGNOSIS — R42 Dizziness and giddiness: Secondary | ICD-10-CM | POA: Insufficient documentation

## 2016-02-24 LAB — CBC WITH DIFFERENTIAL/PLATELET
BASOS ABS: 0.1 10*3/uL (ref 0.0–0.1)
BASOS PCT: 0.7 % (ref 0.0–3.0)
EOS ABS: 0.4 10*3/uL (ref 0.0–0.7)
Eosinophils Relative: 5.5 % — ABNORMAL HIGH (ref 0.0–5.0)
HCT: 39.7 % (ref 39.0–52.0)
HEMOGLOBIN: 13.5 g/dL (ref 13.0–17.0)
LYMPHS PCT: 27.4 % (ref 12.0–46.0)
Lymphs Abs: 2 10*3/uL (ref 0.7–4.0)
MCHC: 34 g/dL (ref 30.0–36.0)
MCV: 94.2 fl (ref 78.0–100.0)
MONO ABS: 0.8 10*3/uL (ref 0.1–1.0)
Monocytes Relative: 11 % (ref 3.0–12.0)
NEUTROS ABS: 4.1 10*3/uL (ref 1.4–7.7)
Neutrophils Relative %: 55.4 % (ref 43.0–77.0)
PLATELETS: 187 10*3/uL (ref 150.0–400.0)
RBC: 4.21 Mil/uL — ABNORMAL LOW (ref 4.22–5.81)
RDW: 13.3 % (ref 11.5–15.5)
WBC: 7.4 10*3/uL (ref 4.0–10.5)

## 2016-02-24 LAB — T4, FREE: Free T4: 0.68 ng/dL (ref 0.60–1.60)

## 2016-02-24 LAB — BASIC METABOLIC PANEL
BUN: 21 mg/dL (ref 6–23)
CHLORIDE: 101 meq/L (ref 96–112)
CO2: 29 mEq/L (ref 19–32)
CREATININE: 1.15 mg/dL (ref 0.40–1.50)
Calcium: 10.1 mg/dL (ref 8.4–10.5)
GFR: 65.84 mL/min (ref >60.00)
Glucose, Bld: 103 mg/dL — ABNORMAL HIGH (ref 70–99)
Potassium: 4.1 mEq/L (ref 3.5–5.1)
Sodium: 137 mEq/L (ref 135–145)

## 2016-02-24 LAB — CARDIAC PANEL
CK MB: 6.1 ng/mL — AB (ref 0.3–4.0)
RELATIVE INDEX: 1.9 calc (ref 0.0–2.5)
Total CK: 329 U/L — ABNORMAL HIGH (ref 7–232)

## 2016-02-24 LAB — TSH: TSH: 3.03 u[IU]/mL (ref 0.35–4.50)

## 2016-02-24 LAB — TROPONIN I: TNIDX: 0.01 ug/L (ref 0.00–0.06)

## 2016-02-24 NOTE — Patient Instructions (Signed)

## 2016-02-24 NOTE — Progress Notes (Signed)
Subjective:  Patient ID: Nicholas Buck, male    DOB: [birth year]  Age: 75 y.o. MRN: [id number]  CC: Shortness of Breath and Hypertension   HPI Nicholas Buck presents for a 4 week history of severe dizziness and vertigo and feeling off balance. He complains of diffuse weakness and fatigue, he has also had dyspnea on exertion but denies chest pain, SOB at rest, edema, syncope, or palpitations.   Outpatient Prescriptions Prior to Visit  Medication Sig Dispense Refill  . aspirin EC 81 MG tablet Take 1 tablet (81 mg total) by mouth daily. 30 tablet 11  . atorvastatin (LIPITOR) 20 MG tablet TAKE 1 TABLET BY MOUTH EVERY DAY 90 tablet 3  . Azilsartan-Chlorthalidone (EDARBYCLOR) 40-12.5 MG TABS Take 1 tablet by mouth daily. 30 tablet 11  . ferrous sulfate 325 (65 FE) MG tablet Take 325 mg by mouth daily with breakfast.    . gabapentin (NEURONTIN) 800 MG tablet Take 800 mg by mouth 2 (two) times daily.    Marland Kitchen tiZANidine (ZANAFLEX) 2 MG tablet     . traZODone (DESYREL) 100 MG tablet Take 1 tablet (100 mg total) by mouth at bedtime. 90 tablet 3   No facility-administered medications prior to visit.    ROS Review of Systems  Constitutional: Positive for fatigue. Negative for chills, diaphoresis, appetite change and unexpected weight change.  HENT: Negative.  Negative for trouble swallowing and voice change.   Eyes: Negative.  Negative for visual disturbance.  Respiratory: Positive for shortness of breath. Negative for apnea, cough, choking, chest tightness, wheezing and stridor.   Cardiovascular: Negative.  Negative for chest pain, palpitations and leg swelling.  Gastrointestinal: Negative.  Negative for nausea, abdominal pain, diarrhea, constipation and blood in stool.  Endocrine: Negative.   Genitourinary: Negative.   Musculoskeletal: Positive for neck pain. Negative for back pain.  Skin: Negative.   Allergic/Immunologic: Negative.   Neurological: Positive for dizziness and numbness.  Negative for tremors, seizures, syncope, facial asymmetry, speech difficulty, light-headedness and headaches.       He has chronic, unchanged numbness in both feet.  Hematological: Negative.  Negative for adenopathy. Does not bruise/bleed easily.  Psychiatric/Behavioral: Negative.  Negative for sleep disturbance, dysphoric mood, decreased concentration and agitation. The patient is not nervous/anxious.     Objective:  BP 108/68 mmHg  Pulse 81  Temp(Src) 98.2 F (36.8 C) (Oral)  Resp 16  Ht '6\' 1"'$  (1.854 m)  Wt 203 lb (92.08 kg)  BMI 26.79 kg/m2  SpO2 96%  BP Readings from Last 3 Encounters:  02/24/16 108/68  11/09/15 103/60  10/20/15 122/78    Wt Readings from Last 3 Encounters:  02/24/16 203 lb (92.08 kg)  11/09/15 200 lb 12.8 oz (91.082 kg)  10/20/15 196 lb (88.905 kg)    Physical Exam  Constitutional: He is oriented to person, place, and time. He appears well-developed and well-nourished. No distress.  HENT:  Head: Normocephalic and atraumatic.  Nose: Nose normal.  Mouth/Throat: Oropharynx is clear and moist. No oropharyngeal exudate.  Eyes: Conjunctivae and EOM are normal. Pupils are equal, round, and reactive to light. Right eye exhibits no discharge. Left eye exhibits no discharge. No scleral icterus.  Neck: Normal range of motion. Neck supple. No JVD present. No tracheal deviation present. No thyromegaly present.  Cardiovascular: Normal rate, regular rhythm, normal heart sounds and intact distal pulses.  Exam reveals no gallop and no friction rub.   No murmur heard. Pulses:      Carotid pulses are  1+ on the right side, and 1+ on the left side.      Radial pulses are 1+ on the right side, and 1+ on the left side.       Femoral pulses are 1+ on the right side, and 1+ on the left side.      Popliteal pulses are 1+ on the right side, and 1+ on the left side.       Dorsalis pedis pulses are 1+ on the right side, and 1+ on the left side.       Posterior tibial pulses are  1+ on the right side, and 1+ on the left side.  EKG -  Sinus  Rhythm  WITHIN NORMAL LIMITS  Pulmonary/Chest: Effort normal and breath sounds normal. No stridor. No respiratory distress. He has no wheezes. He has no rales. He exhibits no tenderness.  Abdominal: Soft. Bowel sounds are normal. He exhibits no distension and no mass. There is no tenderness. There is no rebound and no guarding.  Musculoskeletal: Normal range of motion. He exhibits no edema or tenderness.  Lymphadenopathy:    He has no cervical adenopathy.  Neurological: He is alert and oriented to person, place, and time. He has normal strength. He displays no atrophy, no tremor and normal reflexes. No cranial nerve deficit or sensory deficit. He exhibits normal muscle tone. He displays a negative Romberg sign. He displays no seizure activity. Coordination and gait normal. He displays no Babinski's sign on the right side. He displays no Babinski's sign on the left side.  Reflex Scores:      Tricep reflexes are 1+ on the right side and 1+ on the left side.      Bicep reflexes are 1+ on the right side and 1+ on the left side.      Brachioradialis reflexes are 1+ on the right side and 1+ on the left side.      Patellar reflexes are 2+ on the right side and 2+ on the left side.      Achilles reflexes are 1+ on the right side and 1+ on the left side. Skin: Skin is warm and dry. No rash noted. He is not diaphoretic. No erythema. No pallor.  Psychiatric: He has a normal mood and affect. His behavior is normal. Judgment and thought content normal.  Vitals reviewed.   Lab Results  Component Value Date   WBC 7.4 02/24/2016   HGB 13.5 02/24/2016   HCT 39.7 02/24/2016   PLT 187.0 02/24/2016   GLUCOSE 103* 02/24/2016   CHOL 151 09/08/2015   TRIG 108 09/08/2015   HDL 35* 09/08/2015   LDLDIRECT 103.9 07/28/2014   LDLCALC 94 09/08/2015   ALT 24 09/07/2015   AST 28 09/07/2015   NA 137 02/24/2016   K 4.1 02/24/2016   CL 101 02/24/2016     CREATININE 1.15 02/24/2016   BUN 21 02/24/2016   CO2 29 02/24/2016   TSH 3.03 02/24/2016   INR 1.11 09/08/2015   HGBA1C 5.6 09/08/2015    Mr Brain Wo Contrast  10/04/2015  CLINICAL DATA:  75 year old male with hyperlipidemia. Recent pontine infarct. Subsequent encounter. Parfait trial protocol. EXAM: MRI HEAD WITHOUT CONTRAST TECHNIQUE: Multiplanar, multiecho pulse sequences of the brain and surrounding structures were obtained without intravenous contrast. COMPARISON:  09/08/2015 and 09/07/2015 MR. FINDINGS: Subacute nonhemorrhagic right paracentral pontine infarct (tiny region of altered signal intensity within the infarct on gradient sequence appears linear and may represent a small vessel rather than hemorrhage). Very small  acute right parietal lobe nonhemorrhagic infarct. Moderate small vessel disease type changes. No hydrocephalus. No intracranial mass lesion noted on this unenhanced exam. Major intracranial vascular structures are patent. IMPRESSION: Parfait trial protocol exam. Subacute nonhemorrhagic right paracentral pontine infarct (tiny region of altered signal intensity within the infarct on gradient sequence appears linear and may represent a small vessel rather than hemorrhage). Very small acute right parietal lobe nonhemorrhagic infarct. Electronically Signed   By: Genia Del M.D.   On: 10/04/2015 09:20    Assessment & Plan:   Hyde was seen today for shortness of breath and hypertension.  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 -     MR Brain Wo Contrast; Future  Cerebrovascular accident (CVA) due to thrombosis of basilar artery ([place])- we'll recheck an MRI to see if there is been a change and he will continue risk factor modification. 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 -     MR Brain Wo Contrast; Future  Dyspnea on exertion- his EKG is normal, his cardiac enzymes are negative for ischemia, I've asked him to undergone an exercise tolerance stress to screen for coronary artery disease. His labs do not show any secondary causes for shortness of breath. -     EKG 12-Lead -     CBC with Differential/Platelet; Future -     Troponin I; Future -     Cardiac panel; Future -     Exercise Tolerance Test; Future  Essential hypertension- his blood pressure is well controlled, electrolytes and renal function are stable. -     Basic metabolic panel; Future -     CBC with Differential/Platelet; Future -     TSH; Future -     T4, free; Future   I am having Mr. Bardon maintain his traZODone, atorvastatin, gabapentin, Azilsartan-Chlorthalidone, aspirin EC, tiZANidine, and ferrous sulfate.  No orders of the defined types were placed in this encounter.     Follow-up: Return in about 4 weeks (around 03/23/2016).  Scarlette Calico, MD

## 2016-02-24 NOTE — Progress Notes (Signed)
Pre visit review using our clinic review tool, if applicable. No additional management support is needed unless otherwise documented below in the visit note. 

## 2016-03-02 ENCOUNTER — Ambulatory Visit (INDEPENDENT_AMBULATORY_CARE_PROVIDER_SITE_OTHER): Payer: Medicare Other

## 2016-03-02 DIAGNOSIS — R0609 Other forms of dyspnea: Secondary | ICD-10-CM | POA: Diagnosis not present

## 2016-03-03 ENCOUNTER — Other Ambulatory Visit: Payer: Self-pay | Admitting: Internal Medicine

## 2016-03-03 DIAGNOSIS — R42 Dizziness and giddiness: Secondary | ICD-10-CM

## 2016-03-03 DIAGNOSIS — I493 Ventricular premature depolarization: Secondary | ICD-10-CM | POA: Insufficient documentation

## 2016-03-03 LAB — EXERCISE TOLERANCE TEST
CHL CUP MPHR: 145 {beats}/min
CHL CUP RESTING HR STRESS: 75 {beats}/min
CHL CUP STRESS STAGE 1 HR: 76 {beats}/min
CHL CUP STRESS STAGE 2 SPEED: 1 mph
CHL CUP STRESS STAGE 3 SPEED: 1 mph
CHL CUP STRESS STAGE 4 DBP: 63 mmHg
CHL CUP STRESS STAGE 4 GRADE: 10 %
CHL CUP STRESS STAGE 4 HR: 120 {beats}/min
CHL CUP STRESS STAGE 5 HR: 125 {beats}/min
CHL CUP STRESS STAGE 6 DBP: 63 mmHg
CHL CUP STRESS STAGE 6 GRADE: 0 %
CHL CUP STRESS STAGE 7 GRADE: 0 %
CHL CUP STRESS STAGE 7 HR: 78 {beats}/min
CHL CUP STRESS STAGE 7 SPEED: 0 mph
CHL RATE OF PERCEIVED EXERTION: 16
CSEPED: 3 min
CSEPEDS: 19 s
CSEPEW: 4.9 METS
CSEPHR: 86 %
Peak HR: 125 {beats}/min
Percent of predicted max HR: 86 %
Stage 1 DBP: 64 mmHg
Stage 1 Grade: 0 %
Stage 1 SBP: 108 mmHg
Stage 1 Speed: 0 mph
Stage 2 Grade: 0 %
Stage 2 HR: 78 {beats}/min
Stage 3 Grade: 0.1 %
Stage 3 HR: 78 {beats}/min
Stage 4 SBP: 158 mmHg
Stage 4 Speed: 1.7 mph
Stage 5 Grade: 12 %
Stage 5 Speed: 2.5 mph
Stage 6 HR: 104 {beats}/min
Stage 6 SBP: 177 mmHg
Stage 6 Speed: 0 mph
Stage 7 DBP: 66 mmHg
Stage 7 SBP: 124 mmHg

## 2016-03-06 ENCOUNTER — Ambulatory Visit
Admission: RE | Admit: 2016-03-06 | Discharge: 2016-03-06 | Disposition: A | Discharge status: Home / Self Care | Payer: Medicare Other | Source: Ambulatory Visit | Attending: Internal Medicine | Admitting: Internal Medicine

## 2016-03-06 DIAGNOSIS — R200 Anesthesia of skin: Secondary | ICD-10-CM | POA: Diagnosis not present

## 2016-03-06 DIAGNOSIS — R42 Dizziness and giddiness: Secondary | ICD-10-CM | POA: Diagnosis not present

## 2016-03-06 DIAGNOSIS — I6350 Cerebral infarction due to unspecified occlusion or stenosis of unspecified cerebral artery: Secondary | ICD-10-CM

## 2016-03-06 DIAGNOSIS — I6302 Cerebral infarction due to thrombosis of basilar artery: Secondary | ICD-10-CM

## 2016-03-08 ENCOUNTER — Telehealth: Payer: Self-pay | Admitting: Internal Medicine

## 2016-03-08 NOTE — Telephone Encounter (Signed)
Pt said that [facility] can not see him until 03/29/16. He was wondering if we know other places that can see him sooner. [facility] please help

## 2016-03-09 NOTE — Telephone Encounter (Signed)
Called cardiology they didn't have any earlier appt. Will put pt on cancellation list.

## 2016-03-23 ENCOUNTER — Ambulatory Visit: Payer: Medicare Other

## 2016-03-23 ENCOUNTER — Encounter: Payer: Self-pay | Admitting: Internal Medicine

## 2016-03-23 ENCOUNTER — Ambulatory Visit (INDEPENDENT_AMBULATORY_CARE_PROVIDER_SITE_OTHER): Payer: Medicare Other | Admitting: Internal Medicine

## 2016-03-23 VITALS — BP 116/62 | HR 85 | Temp 98.3°F | Resp 16 | Ht 73.0 in | Wt 204.0 lb

## 2016-03-23 DIAGNOSIS — I10 Essential (primary) hypertension: Secondary | ICD-10-CM | POA: Diagnosis not present

## 2016-03-23 DIAGNOSIS — G63 Polyneuropathy in diseases classified elsewhere: Principal | ICD-10-CM

## 2016-03-23 DIAGNOSIS — I6302 Cerebral infarction due to thrombosis of basilar artery: Secondary | ICD-10-CM | POA: Diagnosis not present

## 2016-03-23 DIAGNOSIS — E538 Deficiency of other specified B group vitamins: Secondary | ICD-10-CM

## 2016-03-23 MED ORDER — CYANOCOBALAMIN 1000 MCG/ML IJ SOLN
1000.0000 ug | Freq: Once | INTRAMUSCULAR | Status: AC
  Administered 2016-03-23: 1000 ug via INTRAMUSCULAR

## 2016-03-23 MED ORDER — TELMISARTAN 80 MG PO TABS: 80.0000 mg | ORAL_TABLET | Freq: Every day | ORAL | Status: DC

## 2016-03-23 NOTE — Progress Notes (Signed)
Subjective:  Patient ID: Nicholas Buck, male    DOB: October 20, 1941  Age: 75 y.o. MRN: [id number]  CC: Hypertension   HPI Nicholas Buck presents for a blood pressure check. He complains of chronic dyspnea on exertion and fatigue. Over the last few weeks he has also developed dizziness and lightheadedness. He denies headache, chest pain, palpitations, nausea, vomiting, numbness, weakness, tingling.  Outpatient Prescriptions Prior to Visit  Medication Sig Dispense Refill  . aspirin EC 81 MG tablet Take 1 tablet (81 mg total) by mouth daily. 30 tablet 11  . atorvastatin (LIPITOR) 20 MG tablet TAKE 1 TABLET BY MOUTH EVERY DAY 90 tablet 3  . ferrous sulfate 325 (65 FE) MG tablet Take 325 mg by mouth daily with breakfast.    . gabapentin (NEURONTIN) 800 MG tablet Take 800 mg by mouth 2 (two) times daily.    Marland Kitchen tiZANidine (ZANAFLEX) 2 MG tablet     . traZODone (DESYREL) 100 MG tablet Take 1 tablet (100 mg total) by mouth at bedtime. 90 tablet 3  . Azilsartan-Chlorthalidone (EDARBYCLOR) 40-12.5 MG TABS Take 1 tablet by mouth daily. 30 tablet 11   No facility-administered medications prior to visit.    ROS Review of Systems  Constitutional: Positive for fatigue.  HENT: Negative.  Negative for congestion, sinus pressure and trouble swallowing.   Eyes: Negative.  Negative for visual disturbance.  Respiratory: Positive for shortness of breath. Negative for cough, choking, chest tightness and stridor.   Cardiovascular: Negative.  Negative for chest pain, palpitations and leg swelling.  Gastrointestinal: Negative.  Negative for nausea, vomiting, abdominal pain, diarrhea, constipation and blood in stool.  Endocrine: Negative.   Genitourinary: Negative.   Musculoskeletal: Negative.  Negative for myalgias, back pain, joint swelling, arthralgias and neck pain.  Skin: Negative.  Negative for color change, pallor and rash.  Allergic/Immunologic: Negative.   Neurological: Positive for dizziness and  light-headedness. Negative for tremors, seizures, syncope, facial asymmetry, speech difficulty, weakness, numbness and headaches.  Hematological: Negative.  Negative for adenopathy. Does not bruise/bleed easily.  Psychiatric/Behavioral: Negative.     Objective:  BP 116/62 mmHg  Pulse 85  Temp(Src) 98.3 F (36.8 C) (Oral)  Resp 16  Ht '6\' 1"'$  (1.854 m)  Wt 204 lb (92.534 kg)  BMI 26.92 kg/m2  SpO2 95%  BP Readings from Last 3 Encounters:  03/23/16 116/62  02/24/16 108/68  11/09/15 103/60    Wt Readings from Last 3 Encounters:  03/23/16 204 lb (92.534 kg)  02/24/16 203 lb (92.08 kg)  11/09/15 200 lb 12.8 oz (91.082 kg)    Physical Exam  Constitutional: He is oriented to person, place, and time. No distress.  HENT:  Head: Normocephalic and atraumatic.  Mouth/Throat: Oropharynx is clear and moist. No oropharyngeal exudate.  Eyes: Conjunctivae and EOM are normal. Pupils are equal, round, and reactive to light. Right eye exhibits no discharge. Left eye exhibits no discharge. No scleral icterus.  Neck: Normal range of motion. Neck supple. No JVD present. No tracheal deviation present. No thyromegaly present.  Cardiovascular: Normal rate, regular rhythm, normal heart sounds and intact distal pulses.  Exam reveals no gallop and no friction rub.   No murmur heard. Pulmonary/Chest: Effort normal and breath sounds normal. No stridor. No respiratory distress. He has no wheezes. He has no rales. He exhibits no tenderness.  Abdominal: Soft. Bowel sounds are normal. He exhibits no distension and no mass. There is no tenderness. There is no rebound and no guarding.  Musculoskeletal: Normal  range of motion. He exhibits no edema or tenderness.  Lymphadenopathy:    He has no cervical adenopathy.  Neurological: He is alert and oriented to person, place, and time. He has normal reflexes. He displays normal reflexes. No cranial nerve deficit. He exhibits normal muscle tone. Coordination normal.    Skin: Skin is warm and dry. No rash noted. He is not diaphoretic. No erythema. No pallor.  Psychiatric: He has a normal mood and affect. His behavior is normal. Judgment and thought content normal.  Vitals reviewed.   Lab Results  Component Value Date   WBC 7.4 02/24/2016   HGB 13.5 02/24/2016   HCT 39.7 02/24/2016   PLT 187.0 02/24/2016   GLUCOSE 103* 02/24/2016   CHOL 151 09/08/2015   TRIG 108 09/08/2015   HDL 35* 09/08/2015   LDLDIRECT 103.9 07/28/2014   LDLCALC 94 09/08/2015   ALT 24 09/07/2015   AST 28 09/07/2015   NA 137 02/24/2016   K 4.1 02/24/2016   CL 101 02/24/2016   CREATININE 1.15 02/24/2016   BUN 21 02/24/2016   CO2 29 02/24/2016   TSH 3.03 02/24/2016   INR 1.11 09/08/2015   HGBA1C 5.6 09/08/2015    Mr Brain Wo Contrast  03/06/2016  CLINICAL DATA:  Persistent vertigo, weakness and left-sided numbness. Stroke in 2016. EXAM: MRI HEAD WITHOUT CONTRAST TECHNIQUE: Multiplanar, multiecho pulse sequences of the brain and surrounding structures were obtained without intravenous contrast. COMPARISON:  10/04/2015.  09/08/2015. FINDINGS: There is an old infarction in the right side of the pons. The cerebellum is normal. Cerebral hemispheres show moderate changes of chronic small vessel disease affecting the deep and subcortical white matter. No large vessel territory infarction. No mass lesion, recent hemorrhage, hydrocephalus or extra-axial collection. No pituitary mass. No inflammatory sinus disease. No skull or skullbase lesion. IMPRESSION: No acute or reversible finding. Old infarction in the right side of the pons. Moderate chronic appearing small vessel ischemic changes of the cerebral hemispheric white matter, similar to the studies of last year. Electronically Signed   By: Nelson Chimes M.D.   On: 03/06/2016 16:01    Assessment & Plan:   Bayani was seen today for hypertension.  Diagnoses and all orders for this visit:  Vitamin B12 deficiency neuropathy- continue  monthly B12 injection -     cyanocobalamin ((VITAMIN B-12)) injection 1,000 mcg; Inject 1 mL (1,000 mcg total) into the muscle once.  Cerebrovascular accident (CVA) due to thrombosis of basilar artery ([place])- I don't think his symptoms are consistent with a new neurological event, his neurologic exam is nonfocal -     telmisartan (MICARDIS) 80 MG tablet; Take 1 tablet (80 mg total) by mouth daily.  Essential hypertension- it appears his blood pressure is over controlled, I think this is causing his symptoms, I have asked him to stop taking the diuretic but will continue an ARB. -     telmisartan (MICARDIS) 80 MG tablet; Take 1 tablet (80 mg total) by mouth daily.   I have discontinued Mr. Pruett Azilsartan-Chlorthalidone. I am also having him start on telmisartan. Additionally, I am having him maintain his traZODone, atorvastatin, gabapentin, aspirin EC, tiZANidine, and ferrous sulfate. We administered cyanocobalamin.  Meds ordered this encounter  Medications  . cyanocobalamin ((VITAMIN B-12)) injection 1,000 mcg    Sig:   . telmisartan (MICARDIS) 80 MG tablet    Sig: Take 1 tablet (80 mg total) by mouth daily.    Dispense:  90 tablet    Refill:  1  Follow-up: No Follow-up on file.  Scarlette Calico, MD

## 2016-03-23 NOTE — Progress Notes (Signed)
Pre visit review using our clinic review tool, if applicable. No additional management support is needed unless otherwise documented below in the visit note. 

## 2016-03-23 NOTE — Patient Instructions (Signed)
Hypertension Hypertension, commonly called high blood pressure, is when the force of blood pumping through your arteries is too strong. Your arteries are the blood vessels that carry blood from your heart throughout your body. A blood pressure reading consists of a higher number over a lower number, such as 110/72. The higher number (systolic) is the pressure inside your arteries when your heart pumps. The lower number (diastolic) is the pressure inside your arteries when your heart relaxes. Ideally you want your blood pressure below 120/80. Hypertension forces your heart to work harder to pump blood. Your arteries may become narrow or stiff. Having untreated or uncontrolled hypertension can cause heart attack, stroke, kidney disease, and other problems. RISK FACTORS Some risk factors for high blood pressure are controllable. Others are not.  Risk factors you cannot control include:   Race. You may be at higher risk if you are African American.  Age. Risk increases with age.  Gender. Men are at higher risk than women before age 45 years. After age 65, women are at higher risk than men. Risk factors you can control include:  Not getting enough exercise or physical activity.  Being overweight.  Getting too much fat, sugar, calories, or salt in your diet.  Drinking too much alcohol. SIGNS AND SYMPTOMS Hypertension does not usually cause signs or symptoms. Extremely high blood pressure (hypertensive crisis) may cause headache, anxiety, shortness of breath, and nosebleed. DIAGNOSIS To check if you have hypertension, your health care provider will measure your blood pressure while you are seated, with your arm held at the level of your heart. It should be measured at least twice using the same arm. Certain conditions can cause a difference in blood pressure between your right and left arms. A blood pressure reading that is higher than normal on one occasion does not mean that you need treatment. If  it is not clear whether you have high blood pressure, you may be asked to return on a different day to have your blood pressure checked again. Or, you may be asked to monitor your blood pressure at home for 1 or more weeks. TREATMENT Treating high blood pressure includes making lifestyle changes and possibly taking medicine. Living a healthy lifestyle can help lower high blood pressure. You may need to change some of your habits. Lifestyle changes may include:  Following the DASH diet. This diet is high in fruits, vegetables, and whole grains. It is low in salt, red meat, and added sugars.  Keep your sodium intake below 2,300 mg per day.  Getting at least 30-45 minutes of aerobic exercise at least 4 times per week.  Losing weight if necessary.  Not smoking.  Limiting alcoholic beverages.  Learning ways to reduce stress. Your health care provider may prescribe medicine if lifestyle changes are not enough to get your blood pressure under control, and if one of the following is true:  You are 18-59 years of age and your systolic blood pressure is above 140.  You are 60 years of age or older, and your systolic blood pressure is above 150.  Your diastolic blood pressure is above 90.  You have diabetes, and your systolic blood pressure is over 140 or your diastolic blood pressure is over 90.  You have kidney disease and your blood pressure is above 140/90.  You have heart disease and your blood pressure is above 140/90. Your personal target blood pressure may vary depending on your medical conditions, your age, and other factors. HOME CARE INSTRUCTIONS    Have your blood pressure rechecked as directed by your health care provider.   Take medicines only as directed by your health care provider. Follow the directions carefully. Blood pressure medicines must be taken as prescribed. The medicine does not work as well when you skip doses. Skipping doses also puts you at risk for  problems.  Do not smoke.   Monitor your blood pressure at home as directed by your health care provider. SEEK MEDICAL CARE IF:   You think you are having a reaction to medicines taken.  You have recurrent headaches or feel dizzy.  You have swelling in your ankles.  You have trouble with your vision. SEEK IMMEDIATE MEDICAL CARE IF:  You develop a severe headache or confusion.  You have unusual weakness, numbness, or feel faint.  You have severe chest or abdominal pain.  You vomit repeatedly.  You have trouble breathing. MAKE SURE YOU:   Understand these instructions.  Will watch your condition.  Will get help right away if you are not doing well or get worse.   This information is not intended to replace advice given to you by your health care provider. Make sure you discuss any questions you have with your health care provider.   Document Released: 11/13/2005 Document Revised: 03/30/2015 Document Reviewed: 09/05/2013 Elsevier Interactive Patient Education 2016 [employer]  

## 2016-03-29 ENCOUNTER — Encounter: Payer: Self-pay | Admitting: Cardiovascular Disease

## 2016-03-29 ENCOUNTER — Ambulatory Visit (INDEPENDENT_AMBULATORY_CARE_PROVIDER_SITE_OTHER): Payer: Medicare Other | Admitting: Cardiovascular Disease

## 2016-03-29 VITALS — BP 126/70 | HR 78 | Ht 73.0 in | Wt 205.4 lb

## 2016-03-29 DIAGNOSIS — R9439 Abnormal result of other cardiovascular function study: Secondary | ICD-10-CM | POA: Diagnosis not present

## 2016-03-29 DIAGNOSIS — I493 Ventricular premature depolarization: Secondary | ICD-10-CM | POA: Insufficient documentation

## 2016-03-29 DIAGNOSIS — I10 Essential (primary) hypertension: Secondary | ICD-10-CM | POA: Diagnosis not present

## 2016-03-29 MED ORDER — METOPROLOL TARTRATE 25 MG PO TABS: 25.0000 mg | ORAL_TABLET | Freq: Two times a day (BID) | ORAL | Status: DC

## 2016-03-29 NOTE — Patient Instructions (Addendum)
Medication Instructions:  START Metoprolol (Lopressor) 25 mg twice daily - take every 12 hours   Labwork: None Ordered   Testing/Procedures: Your physician has requested that you have a lexiscan myoview. For further information please visit [email or web address]. Please follow instruction sheet, as given.   Follow-Up: Your physician recommends that you schedule a follow-up appointment in: 3 months with Dr. Acie Fredrickson   If you need a refill on your cardiac medications before your next appointment, please call your pharmacy.   Thank you for choosing [facility]! Christen Bame, RN [phone]

## 2016-03-29 NOTE — Progress Notes (Signed)
Cardiology Office Note   Date:  03/29/2016   ID:  Nicholas Buck, DOB [birth year], MRN [id number]  PCP:  Nicholas Calico, MD  Cardiologist:   Nicholas Fredrickson Wonda Cheng, MD   No chief complaint on file.  Problem List 1. History of Right [place] stroke-  October , 2016 2. Essential HTN    History of Present Illness: Nicholas Buck is a 75 y.o. male who presents for  for further evaluation of a nap normal stress test.  Nicholas Buck has had lots of fatigue and lack of energy over the past several hours. He had a stroke in October. He was sent over here for a stress test by his medical doctor. The stress test showed frequent premature ventricular contractions and several couplets and occasional triplets at the end exercise. He was referred to [place] for further evaluation. He had no symptoms during the stress test. He had multiple PVCs/couplets, triplets at peak exercise which eventually resolved by the end of the exercise test.  He had an echocardiogram in October, 2016 at the time of the stroke. He was found have normal left ventricle systolic function with an ejection fraction of 60-65%. He does have grade 1 diastolic dysfunction.  He denies any chest pain or shortness breath. His only real symptom is that of fatigue.   He does lots of yard work and plays golf.   He does not go to the gym.   He's having very mild balance symptoms since the stroke that not all that severe.  He is retired from [employer]   Past Medical History  Diagnosis Date  . Anemia     microcytic  . COPD (chronic obstructive pulmonary disease) ([place])     ?  Marland Kitchen Hyperlipidemia   . GERD (gastroesophageal reflux disease)   . Stroke Nicholas Buck)     Past Surgical History  Procedure Laterality Date  . Knee surgery      LEFT  . Neck surgery      DISC  . Colonoscopy    . Polypectomy       Current Outpatient Prescriptions  Medication Sig Dispense Refill  . aspirin EC 81 MG tablet Take 1 tablet (81 mg total) by mouth daily.  30 tablet 11  . atorvastatin (LIPITOR) 20 MG tablet TAKE 1 TABLET BY MOUTH EVERY DAY 90 tablet 3  . ferrous sulfate 325 (65 FE) MG tablet Take 325 mg by mouth daily with breakfast.    . gabapentin (NEURONTIN) 800 MG tablet Take 800 mg by mouth 2 (two) times daily.    Marland Kitchen telmisartan (MICARDIS) 80 MG tablet Take 1 tablet (80 mg total) by mouth daily. 90 tablet 1  . tiZANidine (ZANAFLEX) 2 MG tablet Take 2 mg by mouth every 8 (eight) hours as needed for muscle spasms.     . traZODone (DESYREL) 100 MG tablet Take 1 tablet (100 mg total) by mouth at bedtime. 90 tablet 3   No current facility-administered medications for this visit.    Allergies:   Mevacor    Social History:  The patient  reports that he quit smoking about 3 years ago. His smoking use included Cigarettes. He has a 50 pack-year smoking history. He has never used smokeless tobacco. He reports that he does not drink alcohol or use illicit drugs.   Family History:  The patient's family history includes Heart disease in his father. There is no history of Colon cancer, Esophageal cancer, Rectal cancer, Stomach cancer, Cancer, COPD, Diabetes, Drug abuse,  Early death, Hearing loss, Alcohol abuse, Asthma, Stroke, Hyperlipidemia, Kidney disease, or Hypertension.    ROS:  Please see the history of present illness.    Review of Systems: Constitutional:  denies fever, chills, diaphoresis, appetite change and fatigue.  HEENT: denies photophobia, eye pain, redness, hearing loss, ear pain, congestion, sore throat, rhinorrhea, sneezing, neck pain, neck stiffness and tinnitus.  Respiratory: denies SOB, DOE, cough, chest tightness, and wheezing.  Cardiovascular: denies chest pain, palpitations and leg swelling.  Gastrointestinal: denies nausea, vomiting, abdominal pain, diarrhea, constipation, blood in stool.  Genitourinary: denies dysuria, urgency, frequency, hematuria, flank pain and difficulty urinating.  Musculoskeletal: denies  myalgias,  back pain, joint swelling, arthralgias and gait problem.   Skin: denies pallor, rash and wound.  Neurological: denies dizziness, seizures, syncope, weakness, light-headedness, numbness and headaches.   Hematological: denies adenopathy, easy bruising, personal or family bleeding history.  Psychiatric/ Behavioral: denies suicidal ideation, mood changes, confusion, nervousness, sleep disturbance and agitation.       All other systems are reviewed and negative.    PHYSICAL EXAM: VS:  BP 126/70 mmHg  Pulse 78  Ht '6\' 1"'$  (1.854 m)  Wt 205 lb 6.4 oz (93.169 kg)  BMI 27.11 kg/m2 , BMI Body mass index is 27.11 kg/(m^2). GEN: Well nourished, well developed, in no acute distress HEENT: normal Neck: no JVD, carotid bruits, or masses Cardiac: RRR; no murmurs, rubs, or gallops,no edema  Respiratory:  clear to auscultation bilaterally, normal work of breathing GI: soft, nontender, nondistended, + BS MS: no deformity or atrophy Skin: warm and dry, no rash Neuro:  Strength and sensation are intact Psych: normal   EKG:  EKG is not ordered today. The ekg ordered 02/24/16   demonstrates  NSR . Normal   Review of the GXT - no ST changes to suggest ischemi He had frequent ventricular couplets and triplets.    Recent Labs: 09/07/2015: ALT 24 02/24/2016: BUN 21; Creatinine, Ser 1.15; Hemoglobin 13.5; Platelets 187.0; Potassium 4.1; Sodium 137; TSH 3.03    Lipid Panel    Component Value Date/Time   CHOL 151 09/08/2015 0422   TRIG 108 09/08/2015 0422   HDL 35* 09/08/2015 0422   CHOLHDL 4.3 09/08/2015 0422   VLDL 22 09/08/2015 0422   LDLCALC 94 09/08/2015 0422   LDLDIRECT 103.9 07/28/2014 0924      Wt Readings from Last 3 Encounters:  03/29/16 205 lb 6.4 oz (93.169 kg)  03/23/16 204 lb (92.534 kg)  02/24/16 203 lb (92.08 kg)      Other studies Reviewed: Additional studies/ records that were reviewed today include: . Review of the above records demonstrates:    ASSESSMENT AND  PLAN:  1.  Abnormal GXT: Nicholas Buck was referred over here for symptoms of fatigue and lack of energy. He was scheduled for a GXT. He did not show any ischemia but did show significant ventricular arrhythmias. He had couplets and triplets at peak exercise which resolved at the end of the stress test.  Although this is not a typical positive stress test it is somewhat of an abnormal finding I think that we should perform a [place] study for further evaluation and to rule out ischemia.  We will start him on metoprolol 25 mg twice a day.  It's quite likely that his fatigue is due to a noncardiac etiology. If the [facility] study is normal then I think that we need to look elsewhere for further causes of his fatigue. I'll see him again in 3  months to follow up with the initiation of the metoprolol. If this worsens his fatigue we will need to discontinue it.   Current medicines are reviewed at length with the patient today.  The patient does not have concerns regarding medicines.  The following changes have been made:  no change  Labs/ tests ordered today include:  No orders of the defined types were placed in this encounter.     Disposition:   FU with me in 3 months      Anay Rathe, Wonda Cheng, MD  03/29/2016 3:51 PM    [facility] [place], [place], [place]  [address] Phone: [phone]; Fax: [phone]   [facility]  [address] [place], [place]  [address] [phone]   Fax [phone]

## 2016-04-06 ENCOUNTER — Telehealth (HOSPITAL_COMMUNITY): Payer: Self-pay | Admitting: *Deleted

## 2016-04-06 NOTE — Telephone Encounter (Signed)
Left message on voicemail per DPR in reference to upcoming appointment scheduled on 04/11/16 at 0945 with detailed instructions given per Myocardial Perfusion Study Information Sheet for the test. LM to arrive 15 minutes early, and that it is imperative to arrive on time for appointment to keep from having the test rescheduled. If you need to cancel or reschedule your appointment, please call the office within 24 hours of your appointment. Failure to do so may result in a cancellation of your appointment, and a $50 no show fee. Phone number given for call back for any questions. Tavoris Brisk, Ranae Palms

## 2016-04-11 ENCOUNTER — Ambulatory Visit (HOSPITAL_COMMUNITY): Payer: Medicare Other | Attending: Cardiology

## 2016-04-11 DIAGNOSIS — I10 Essential (primary) hypertension: Secondary | ICD-10-CM | POA: Insufficient documentation

## 2016-04-11 DIAGNOSIS — R9439 Abnormal result of other cardiovascular function study: Secondary | ICD-10-CM | POA: Diagnosis not present

## 2016-04-11 DIAGNOSIS — R002 Palpitations: Secondary | ICD-10-CM | POA: Insufficient documentation

## 2016-04-11 DIAGNOSIS — R0609 Other forms of dyspnea: Secondary | ICD-10-CM | POA: Diagnosis not present

## 2016-04-11 LAB — MYOCARDIAL PERFUSION IMAGING
CHL CUP NUCLEAR SSS: 2
CSEPPHR: 77 {beats}/min
LHR: 0.28
LVDIAVOL: 100 mL (ref 62–150)
LVSYSVOL: 41 mL
NUC STRESS TID: 0.9
Rest HR: 54 {beats}/min
SDS: 0
SRS: 2

## 2016-04-11 MED ORDER — TECHNETIUM TC 99M TETROFOSMIN IV KIT
32.1000 | PACK | Freq: Once | INTRAVENOUS | Status: AC | PRN
  Administered 2016-04-11: 32.1 via INTRAVENOUS
  Filled 2016-04-11: qty 32

## 2016-04-11 MED ORDER — REGADENOSON 0.4 MG/5ML IV SOLN
0.4000 mg | Freq: Once | INTRAVENOUS | Status: AC
  Administered 2016-04-11: 0.4 mg via INTRAVENOUS

## 2016-04-11 MED ORDER — TECHNETIUM TC 99M TETROFOSMIN IV KIT
10.7000 | PACK | Freq: Once | INTRAVENOUS | Status: AC | PRN
  Administered 2016-04-11: 11 via INTRAVENOUS
  Filled 2016-04-11: qty 11

## 2016-04-13 ENCOUNTER — Telehealth: Payer: Self-pay | Admitting: Cardiovascular Disease

## 2016-04-13 NOTE — Telephone Encounter (Signed)
Pt returned call from [facility] yesterday regarding stress test results-pls call [phone] -ok to leave message with results

## 2016-04-13 NOTE — Telephone Encounter (Signed)
Pt made aware of results no questions at this time.   

## 2016-04-18 ENCOUNTER — Ambulatory Visit: Payer: Medicare Other | Admitting: Internal Medicine

## 2016-04-21 ENCOUNTER — Ambulatory Visit (INDEPENDENT_AMBULATORY_CARE_PROVIDER_SITE_OTHER): Payer: Medicare Other

## 2016-04-21 DIAGNOSIS — G63 Polyneuropathy in diseases classified elsewhere: Principal | ICD-10-CM

## 2016-04-21 DIAGNOSIS — E538 Deficiency of other specified B group vitamins: Secondary | ICD-10-CM

## 2016-04-21 MED ORDER — CYANOCOBALAMIN 1000 MCG/ML IJ SOLN
1000.0000 ug | Freq: Once | INTRAMUSCULAR | Status: AC
  Administered 2016-04-21: 1000 ug via INTRAMUSCULAR

## 2016-05-22 ENCOUNTER — Ambulatory Visit (INDEPENDENT_AMBULATORY_CARE_PROVIDER_SITE_OTHER): Payer: Medicare Other

## 2016-05-22 DIAGNOSIS — E538 Deficiency of other specified B group vitamins: Secondary | ICD-10-CM

## 2016-05-22 MED ORDER — CYANOCOBALAMIN 1000 MCG/ML IJ SOLN
1000.0000 ug | Freq: Once | INTRAMUSCULAR | Status: AC
  Administered 2016-05-22: 1000 ug via INTRAMUSCULAR

## 2016-06-12 ENCOUNTER — Other Ambulatory Visit: Payer: Self-pay | Admitting: Internal Medicine

## 2016-06-19 ENCOUNTER — Ambulatory Visit (INDEPENDENT_AMBULATORY_CARE_PROVIDER_SITE_OTHER): Payer: Medicare Other

## 2016-06-19 DIAGNOSIS — E538 Deficiency of other specified B group vitamins: Secondary | ICD-10-CM | POA: Diagnosis not present

## 2016-06-19 MED ORDER — CYANOCOBALAMIN 1000 MCG/ML IJ SOLN
1000.0000 ug | Freq: Once | INTRAMUSCULAR | Status: AC
  Administered 2016-06-19: 1000 ug via INTRAMUSCULAR

## 2016-06-21 ENCOUNTER — Other Ambulatory Visit: Payer: Self-pay | Admitting: Internal Medicine

## 2016-06-21 ENCOUNTER — Ambulatory Visit: Payer: Medicare Other

## 2016-07-20 ENCOUNTER — Ambulatory Visit (INDEPENDENT_AMBULATORY_CARE_PROVIDER_SITE_OTHER): Payer: Medicare Other | Admitting: General Practice

## 2016-07-20 DIAGNOSIS — E538 Deficiency of other specified B group vitamins: Secondary | ICD-10-CM

## 2016-07-20 MED ORDER — CYANOCOBALAMIN 1000 MCG/ML IJ SOLN
1000.0000 ug | Freq: Once | INTRAMUSCULAR | Status: AC
  Administered 2016-07-20: 1000 ug via INTRAMUSCULAR

## 2016-07-25 ENCOUNTER — Ambulatory Visit (INDEPENDENT_AMBULATORY_CARE_PROVIDER_SITE_OTHER): Payer: Medicare Other | Admitting: Cardiovascular Disease

## 2016-07-25 ENCOUNTER — Encounter: Payer: Self-pay | Admitting: Cardiovascular Disease

## 2016-07-25 VITALS — BP 134/66 | HR 74 | Ht 73.0 in | Wt 203.8 lb

## 2016-07-25 DIAGNOSIS — I493 Ventricular premature depolarization: Secondary | ICD-10-CM | POA: Diagnosis not present

## 2016-07-25 DIAGNOSIS — I10 Essential (primary) hypertension: Secondary | ICD-10-CM | POA: Diagnosis not present

## 2016-07-25 NOTE — Progress Notes (Signed)
Cardiology Office Note   Date:  07/25/2016   ID:  Nicholas Buck, DOB 1941-01-23, MRN [id number]  PCP:  Scarlette Calico, MD  Cardiologist:   Mertie Moores, MD   No chief complaint on file.  Problem List 1. History of Right [place] stroke-  October , 2016 2. Essential HTN    History of Present Illness: Nicholas Buck is a 75 y.o. male who presents for  for further evaluation of a abnormal stress test.  Nicholas Buck has had lots of fatigue and lack of energy over the past several hours. He had a stroke in October. He was sent over here for a stress test by his medical doctor. The stress test showed frequent premature ventricular contractions and several couplets and occasional triplets at the end exercise. He was referred to [place] for further evaluation. He had no symptoms during the stress test. He had multiple PVCs/couplets, triplets at peak exercise which eventually resolved by the end of the exercise test.  He had an echocardiogram in October, 2016 at the time of the stroke. He was found have normal left ventricle systolic function with an ejection fraction of 60-65%. He does have grade 1 diastolic dysfunction.  He denies any chest pain or shortness breath. His only real symptom is that of fatigue.   He does lots of yard work and plays golf.   He does not go to the gym.   He's having very mild balance symptoms since the stroke that not all that severe.  He is retired from [employer]   Aug. 29, 2017:  Doing well He had an abnormal stress test that revealed significant ventricular ectopy. A follow-up Lexiscan Myoview study was low risk and showed no ischemia.  He was started on metoprolol 25 mg twice a day and seems to be doing quite well. He's tolerating medicine without competitions. Has lots of head congestion - thinks its allergies.   Has lots of fatigue.   The fatigue started before we started the metoprolol so I doubt that this is the cause.    Past Medical History:    Diagnosis Date  . Anemia    microcytic  . COPD (chronic obstructive pulmonary disease) ([place])    ?  Marland Kitchen GERD (gastroesophageal reflux disease)   . Hyperlipidemia   . Stroke [facility])     Past Surgical History:  Procedure Laterality Date  . COLONOSCOPY    . KNEE SURGERY     LEFT  . NECK SURGERY     DISC  . POLYPECTOMY       Current Outpatient Prescriptions  Medication Sig Dispense Refill  . aspirin EC 81 MG tablet Take 1 tablet (81 mg total) by mouth daily. 30 tablet 11  . atorvastatin (LIPITOR) 20 MG tablet TAKE 1 TABLET BY MOUTH EVERY DAY 90 tablet 2  . ferrous sulfate 325 (65 FE) MG tablet Take 325 mg by mouth daily with breakfast.    . gabapentin (NEURONTIN) 800 MG tablet TAKE 2 TABLETS(1600 MG) BY MOUTH TWICE DAILY 360 tablet 1  . metoprolol tartrate (LOPRESSOR) 25 MG tablet Take 1 tablet (25 mg total) by mouth 2 (two) times daily. 60 tablet 11  . telmisartan (MICARDIS) 80 MG tablet Take 1 tablet (80 mg total) by mouth daily. 90 tablet 1  . tiZANidine (ZANAFLEX) 2 MG tablet Take 2 mg by mouth every 8 (eight) hours as needed for muscle spasms.     . traZODone (DESYREL) 100 MG tablet Take 1 tablet (100  mg total) by mouth at bedtime. 90 tablet 3   No current facility-administered medications for this visit.     Allergies:   Mevacor [lovastatin]    Social History:  The patient  reports that he quit smoking about 4 years ago. His smoking use included Cigarettes. He has a 50.00 pack-year smoking history. He has never used smokeless tobacco. He reports that he does not drink alcohol or use drugs.   Family History:  The patient's family history includes Heart disease in his father.    ROS:  Please see the history of present illness.    Review of Systems: Constitutional:  denies fever, chills, diaphoresis, appetite change and fatigue.  HEENT: denies photophobia, eye pain, redness, hearing loss, ear pain, congestion, sore throat, rhinorrhea, sneezing, neck pain, neck stiffness  and tinnitus.  Respiratory: denies SOB, DOE, cough, chest tightness, and wheezing.  Cardiovascular: denies chest pain, palpitations and leg swelling.  Gastrointestinal: denies nausea, vomiting, abdominal pain, diarrhea, constipation, blood in stool.  Genitourinary: denies dysuria, urgency, frequency, hematuria, flank pain and difficulty urinating.  Musculoskeletal: denies  myalgias, back pain, joint swelling, arthralgias and gait problem.   Skin: denies pallor, rash and wound.  Neurological: denies dizziness, seizures, syncope, weakness, light-headedness, numbness and headaches.   Hematological: denies adenopathy, easy bruising, personal or family bleeding history.  Psychiatric/ Behavioral: denies suicidal ideation, mood changes, confusion, nervousness, sleep disturbance and agitation.       All other systems are reviewed and negative.    PHYSICAL EXAM: VS:  BP 134/66   Pulse 74   Ht '6\' 1"'$  (1.854 m)   Wt 203 lb 12.8 oz (92.4 kg)   BMI 26.89 kg/m  , BMI Body mass index is 26.89 kg/m. GEN: Well nourished, well developed, in no acute distress  HEENT: normal  Neck: no JVD, carotid bruits, or masses Cardiac: RRR; no murmurs, rubs, or gallops,no edema  Respiratory:  clear to auscultation bilaterally, normal work of breathing GI: soft, nontender, nondistended, + BS MS: no deformity or atrophy  Skin: warm and dry, no rash Neuro:  Strength and sensation are intact Psych: normal   EKG:  EKG is not ordered today. The ekg ordered 02/24/16   demonstrates  NSR . Normal   Review of the GXT - no ST changes to suggest ischemi He had frequent ventricular couplets and triplets.    Recent Labs: 09/07/2015: ALT 24 02/24/2016: BUN 21; Creatinine, Ser 1.15; Hemoglobin 13.5; Platelets 187.0; Potassium 4.1; Sodium 137; TSH 3.03    Lipid Panel    Component Value Date/Time   CHOL 151 09/08/2015 0422   TRIG 108 09/08/2015 0422   HDL 35 (L) 09/08/2015 0422   CHOLHDL 4.3 09/08/2015 0422    VLDL 22 09/08/2015 0422   LDLCALC 94 09/08/2015 0422   LDLDIRECT 103.9 07/28/2014 0924      Wt Readings from Last 3 Encounters:  07/25/16 203 lb 12.8 oz (92.4 kg)  04/11/16 205 lb (93 kg)  03/29/16 205 lb 6.4 oz (93.2 kg)      Other studies Reviewed: Additional studies/ records that were reviewed today include: . Review of the above records demonstrates:    ASSESSMENT AND PLAN:  1.  Abnormal GXT: Nicholas Buck was referred over here for symptoms of fatigue and lack of energy. He was scheduled for a GXT. He did not show any ischemia but did show significant ventricular arrhythmias. He had couplets and triplets at peak exercise which resolved at the end of the stress test.  Stress  myoview on Apr 11, 2016 was low risk   Dong well. Will see as needed.   Current medicines are reviewed at length with the patient today.  The patient does not have concerns regarding medicines.  The following changes have been made:  no change  Labs/ tests ordered today include:  No orders of the defined types were placed in this encounter.   Disposition:   FU with me as needed.    Mertie Moores, MD  07/25/2016 9:38 AM    [facility] [place], [place], [place]  [address] Phone: [phone]; Fax: [phone]   [facility]  [address] [place],   [address] [phone]   Fax [phone]

## 2016-07-25 NOTE — Patient Instructions (Signed)
Medication Instructions:  Your physician recommends that you continue on your current medications as directed. Please refer to the Current Medication list given to you today.   Labwork: None Ordered   Testing/Procedures: None Ordered   Follow-Up: Your physician recommends that you schedule a follow-up appointment in: as needed with Dr. Nahser   If you need a refill on your cardiac medications before your next appointment, please call your pharmacy.   Thank you for choosing [facility]! Jadene Stemmer, RN [phone]    

## 2016-08-22 ENCOUNTER — Ambulatory Visit (INDEPENDENT_AMBULATORY_CARE_PROVIDER_SITE_OTHER): Payer: Medicare Other

## 2016-08-22 DIAGNOSIS — E538 Deficiency of other specified B group vitamins: Secondary | ICD-10-CM | POA: Diagnosis not present

## 2016-08-22 DIAGNOSIS — G63 Polyneuropathy in diseases classified elsewhere: Principal | ICD-10-CM

## 2016-08-22 MED ORDER — CYANOCOBALAMIN 1000 MCG/ML IJ SOLN
1000.0000 ug | Freq: Once | INTRAMUSCULAR | Status: AC
  Administered 2016-08-22: 1000 ug via INTRAMUSCULAR

## 2016-09-21 ENCOUNTER — Ambulatory Visit (INDEPENDENT_AMBULATORY_CARE_PROVIDER_SITE_OTHER): Payer: Medicare Other | Admitting: General Practice

## 2016-09-21 DIAGNOSIS — E538 Deficiency of other specified B group vitamins: Secondary | ICD-10-CM

## 2016-09-21 MED ORDER — CYANOCOBALAMIN 1000 MCG/ML IJ SOLN
1000.0000 ug | Freq: Once | INTRAMUSCULAR | Status: AC
  Administered 2016-09-21: 1000 ug via INTRAMUSCULAR

## 2016-10-23 ENCOUNTER — Ambulatory Visit (INDEPENDENT_AMBULATORY_CARE_PROVIDER_SITE_OTHER): Payer: Medicare Other

## 2016-10-23 DIAGNOSIS — G63 Polyneuropathy in diseases classified elsewhere: Secondary | ICD-10-CM | POA: Diagnosis not present

## 2016-10-23 DIAGNOSIS — E538 Deficiency of other specified B group vitamins: Secondary | ICD-10-CM

## 2016-10-23 MED ORDER — CYANOCOBALAMIN 1000 MCG/ML IJ SOLN
1000.0000 ug | Freq: Once | INTRAMUSCULAR | Status: AC
  Administered 2016-10-23: 1000 ug via INTRAMUSCULAR

## 2016-11-14 ENCOUNTER — Other Ambulatory Visit: Payer: Self-pay | Admitting: Internal Medicine

## 2016-11-23 ENCOUNTER — Ambulatory Visit (INDEPENDENT_AMBULATORY_CARE_PROVIDER_SITE_OTHER): Payer: Medicare Other

## 2016-11-23 DIAGNOSIS — E538 Deficiency of other specified B group vitamins: Secondary | ICD-10-CM | POA: Diagnosis not present

## 2016-11-23 MED ORDER — CYANOCOBALAMIN 1000 MCG/ML IJ SOLN
1000.0000 ug | Freq: Once | INTRAMUSCULAR | Status: AC
  Administered 2016-11-23: 1000 ug via INTRAMUSCULAR

## 2016-11-23 NOTE — Progress Notes (Signed)
Injection given.   Eria Lozoya J Rider Ermis, MD  

## 2016-12-05 ENCOUNTER — Ambulatory Visit (INDEPENDENT_AMBULATORY_CARE_PROVIDER_SITE_OTHER): Payer: Medicare Other | Admitting: Family

## 2016-12-05 ENCOUNTER — Ambulatory Visit (INDEPENDENT_AMBULATORY_CARE_PROVIDER_SITE_OTHER)
Admission: RE | Admit: 2016-12-05 | Discharge: 2016-12-05 | Disposition: A | Discharge status: Home / Self Care | Payer: Medicare Other | Source: Ambulatory Visit | Attending: Family | Admitting: Family

## 2016-12-05 ENCOUNTER — Encounter: Payer: Self-pay | Admitting: Family

## 2016-12-05 VITALS — BP 118/82 | HR 88 | Temp 98.2°F | Resp 20 | Ht 73.0 in | Wt 189.0 lb

## 2016-12-05 DIAGNOSIS — R05 Cough: Secondary | ICD-10-CM

## 2016-12-05 DIAGNOSIS — R059 Cough, unspecified: Secondary | ICD-10-CM

## 2016-12-05 LAB — POCT INFLUENZA A/B
INFLUENZA B, POC: NEGATIVE
Influenza A, POC: NEGATIVE

## 2016-12-05 MED ORDER — LEVOFLOXACIN 500 MG PO TABS: 500.0000 mg | ORAL_TABLET | Freq: Every day | ORAL | 0 refills | Status: DC

## 2016-12-05 NOTE — Assessment & Plan Note (Signed)
In office flu test negative. Adventitious lung sounds noted upon exam with concern for possible pneumonia. Obtain chest x-ray. Start levofloxacin. Continue over-the-counter medications as needed for symptom relief and supportive care. Follow-up if symptoms worsen or do not improve.

## 2016-12-05 NOTE — Patient Instructions (Addendum)
Thank you for choosing [facility].  SUMMARY AND INSTRUCTIONS:  [facility] [address] [place], [place], [place] [address], [place] [phone]  Medication:  Your prescription(s) have been submitted to your pharmacy or been printed and provided for you. Please take as directed and contact our office if you believe you are having problem(s) with the medication(s) or have any questions.  Imaging / Radiology:  Please stop by radiology on the basement level of the building for your x-rays. Your results will be released to [place] (or called to you) after review, usually within 72 hours after test completion. If any treatments or changes are necessary, you will be notified at that same time.  Follow up:  If your symptoms worsen or fail to improve, please contact our office for further instruction, or in case of emergency go directly to the emergency room at the closest medical facility.   General Recommendations:    Please drink plenty of fluids.  Get plenty of rest   Sleep in humidified air  Use saline nasal sprays  Netti pot   OTC Medications:  Decongestants - helps relieve congestion   Flonase (generic fluticasone) or Nasacort (generic triamcinolone) - please make sure to use the "cross-over" technique at a 45 degree angle towards the opposite eye as opposed to straight up the nasal passageway.   Sudafed (generic pseudoephedrine - Note this is the one that is available behind the pharmacy counter); Products with phenylephrine (-PE) may also be used but is often not as effective as pseudoephedrine.   If you have HIGH BLOOD PRESSURE - Coricidin HBP; AVOID any product that is -D as this contains pseudoephedrine which may increase your blood pressure.  Afrin (oxymetazoline) every 6-8 hours for up to 3 days.   Allergies - helps relieve runny nose, itchy eyes and sneezing   Claritin (generic loratidine), Allegra (fexofenidine), or Zyrtec (generic  cyrterizine) for runny nose. These medications should not cause drowsiness.  Note - Benadryl (generic diphenhydramine) may be used however may cause drowsiness  Cough -   Delsym or Robitussin (generic dextromethorphan)  Expectorants - helps loosen mucus to ease removal   Mucinex (generic guaifenesin) as directed on the package.  Headaches / General Aches   Tylenol (generic acetaminophen) - DO NOT EXCEED 3 grams (3,000 mg) in a 24 hour time period  Advil/Motrin (generic ibuprofen)   Sore Throat -   Salt water gargle   Chloraseptic (generic benzocaine) spray or lozenges / Sucrets (generic dyclonine)

## 2016-12-05 NOTE — Progress Notes (Signed)
Subjective:    Patient ID: Nicholas Buck, male    DOB: 1941-04-26, 76 y.o.   MRN: [id number]  Chief Complaint  Patient presents with  . Cough    x1 week, headache, dizziness, fatigue, SOB, diarrhea, cough and congestion    HPI:  Nicholas Buck is a 76 y.o. male who  has a past medical history of Anemia; COPD (chronic obstructive pulmonary disease) ([place]); GERD (gastroesophageal reflux disease); Hyperlipidemia; and Stroke ([place]). and presents today for an acute office visit.   This is a new problem. Associated symptoms of headache, dizziness, fatigue, shortness of breath, diarrhea, cough and congestion. Denies fevers. Modifying factors include Tylenol and Nyquil which did not help very much with his symptoms. Has had experience of feelings that the symptoms wax and wane. No recent antibiotics. Notes that his wife is also sick with similar symptoms.    Allergies  Allergen Reactions  . Mevacor [Lovastatin]     Muscle aches      Outpatient Medications Prior to Visit  Medication Sig Dispense Refill  . aspirin EC 81 MG tablet Take 1 tablet (81 mg total) by mouth daily. 30 tablet 11  . atorvastatin (LIPITOR) 20 MG tablet TAKE 1 TABLET BY MOUTH EVERY DAY 90 tablet 2  . ferrous sulfate 325 (65 FE) MG tablet Take 325 mg by mouth daily with breakfast.    . gabapentin (NEURONTIN) 800 MG tablet TAKE 2 TABLETS(1600 MG) BY MOUTH TWICE DAILY 360 tablet 0  . metoprolol tartrate (LOPRESSOR) 25 MG tablet Take 1 tablet (25 mg total) by mouth 2 (two) times daily. 60 tablet 11  . telmisartan (MICARDIS) 80 MG tablet Take 1 tablet (80 mg total) by mouth daily. 90 tablet 1  . tiZANidine (ZANAFLEX) 2 MG tablet Take 2 mg by mouth every 8 (eight) hours as needed for muscle spasms.     . traZODone (DESYREL) 100 MG tablet Take 1 tablet (100 mg total) by mouth at bedtime. 90 tablet 3   No facility-administered medications prior to visit.       Past Surgical History:  Procedure Laterality Date  .  COLONOSCOPY    . KNEE SURGERY     LEFT  . NECK SURGERY     DISC  . POLYPECTOMY        Past Medical History:  Diagnosis Date  . Anemia    microcytic  . COPD (chronic obstructive pulmonary disease) ([place])    ?  Marland Kitchen GERD (gastroesophageal reflux disease)   . Hyperlipidemia   . Stroke [facility])       Review of Systems  Constitutional: Negative for chills and fever.  HENT: Positive for congestion. Negative for sinus pain, sinus pressure and sore throat.   Respiratory: Positive for cough, chest tightness and shortness of breath.   Neurological: Negative for headaches.      Objective:    BP 118/82 (BP Location: Left Arm, Patient Position: Sitting, Cuff Size: Normal)   Pulse 88   Temp 98.2 F (36.8 C) (Oral)   Resp 20   Ht '6\' 1"'$  (1.854 m)   Wt 189 lb (85.7 kg)   SpO2 98%   BMI 24.94 kg/m  Nursing note and vital signs reviewed.  Physical Exam  Constitutional: He is oriented to person, place, and time. He appears well-developed and well-nourished.  Non-toxic appearance. He has a sickly appearance. He appears ill. No distress.  HENT:  Right Ear: Hearing, tympanic membrane, external ear and ear canal normal.  Left Ear: Hearing,  tympanic membrane, external ear and ear canal normal.  Nose: Nose normal.  Cardiovascular: Normal rate, regular rhythm, normal heart sounds and intact distal pulses.   Pulmonary/Chest: Effort normal. No respiratory distress. He has no wheezes. He has rales. He exhibits no tenderness.  Neurological: He is alert and oriented to person, place, and time.  Skin: Skin is warm and dry.  Psychiatric: He has a normal mood and affect. His behavior is normal. Judgment and thought content normal.       Assessment & Plan:   Problem List Items Addressed This Visit      Other   Cough - Primary    In office flu test negative. Adventitious lung sounds noted upon exam with concern for possible pneumonia. Obtain chest x-ray. Start levofloxacin. Continue  over-the-counter medications as needed for symptom relief and supportive care. Follow-up if symptoms worsen or do not improve.      Relevant Orders   POCT Influenza A/B (Completed)   DG Chest 2 View       I am having Mr. Pfiffner start on levofloxacin. I am also having him maintain his traZODone, aspirin EC, tiZANidine, ferrous sulfate, telmisartan, metoprolol tartrate, atorvastatin, and gabapentin.   Meds ordered this encounter  Medications  . levofloxacin (LEVAQUIN) 500 MG tablet    Sig: Take 1 tablet (500 mg total) by mouth daily.    Dispense:  7 tablet    Refill:  0    Order Specific Question:   Supervising Provider    Answer:   Pricilla Holm A [[id number]]     Follow-up: Return if symptoms worsen or fail to improve.  Mauricio Po, FNP

## 2016-12-12 ENCOUNTER — Telehealth: Payer: Self-pay | Admitting: Emergency Medicine

## 2016-12-12 MED ORDER — AZITHROMYCIN 250 MG PO TABS: ORAL_TABLET | ORAL | 0 refills | Status: DC

## 2016-12-12 NOTE — Telephone Encounter (Signed)
Pt states he was in to see Marya Amsler last week with pneumonia. He is not feeling any better and wants to know what to do next. If you could give him a call back. Thanks.

## 2016-12-12 NOTE — Telephone Encounter (Signed)
Pt aware of abx being sent it

## 2016-12-12 NOTE — Addendum Note (Signed)
Addended by: Mauricio Po D on: 12/12/2016 04:49 PM   Modules accepted: Orders

## 2016-12-12 NOTE — Telephone Encounter (Signed)
Please advise 

## 2016-12-12 NOTE — Telephone Encounter (Signed)
New medication sent to pharmacy

## 2016-12-25 ENCOUNTER — Telehealth: Payer: Self-pay

## 2016-12-25 ENCOUNTER — Ambulatory Visit (INDEPENDENT_AMBULATORY_CARE_PROVIDER_SITE_OTHER): Payer: Medicare Other

## 2016-12-25 DIAGNOSIS — E538 Deficiency of other specified B group vitamins: Secondary | ICD-10-CM

## 2016-12-25 MED ORDER — CYANOCOBALAMIN 1000 MCG/ML IJ SOLN
1000.0000 ug | Freq: Once | INTRAMUSCULAR | Status: AC
  Administered 2016-12-25: 1000 ug via INTRAMUSCULAR

## 2016-12-25 NOTE — Telephone Encounter (Signed)
Patient needs to have office visit with dr Ronnald Ramp and have at least b12 lab done---patient has scheduled appt for physical and b12 labs---patient advised he needs to keep appt so that we know whether to continue b12 injections---after b12 lab results, let patient know future b12 injection plan

## 2016-12-28 ENCOUNTER — Other Ambulatory Visit (INDEPENDENT_AMBULATORY_CARE_PROVIDER_SITE_OTHER): Payer: Medicare Other

## 2016-12-28 ENCOUNTER — Ambulatory Visit (INDEPENDENT_AMBULATORY_CARE_PROVIDER_SITE_OTHER): Payer: Medicare Other | Admitting: Internal Medicine

## 2016-12-28 ENCOUNTER — Telehealth: Payer: Self-pay | Admitting: *Deleted

## 2016-12-28 ENCOUNTER — Encounter: Payer: Self-pay | Admitting: Internal Medicine

## 2016-12-28 VITALS — BP 150/90 | HR 75 | Temp 97.5°F | Resp 16 | Ht 73.0 in | Wt 194.0 lb

## 2016-12-28 DIAGNOSIS — F409 Phobic anxiety disorder, unspecified: Secondary | ICD-10-CM | POA: Diagnosis not present

## 2016-12-28 DIAGNOSIS — R739 Hyperglycemia, unspecified: Secondary | ICD-10-CM

## 2016-12-28 DIAGNOSIS — Z0000 Encounter for general adult medical examination without abnormal findings: Secondary | ICD-10-CM | POA: Diagnosis not present

## 2016-12-28 DIAGNOSIS — E538 Deficiency of other specified B group vitamins: Secondary | ICD-10-CM

## 2016-12-28 DIAGNOSIS — E785 Hyperlipidemia, unspecified: Secondary | ICD-10-CM | POA: Diagnosis not present

## 2016-12-28 DIAGNOSIS — I6302 Cerebral infarction due to thrombosis of basilar artery: Secondary | ICD-10-CM

## 2016-12-28 DIAGNOSIS — R195 Other fecal abnormalities: Secondary | ICD-10-CM

## 2016-12-28 DIAGNOSIS — N400 Enlarged prostate without lower urinary tract symptoms: Secondary | ICD-10-CM | POA: Diagnosis not present

## 2016-12-28 DIAGNOSIS — G609 Hereditary and idiopathic neuropathy, unspecified: Secondary | ICD-10-CM | POA: Diagnosis not present

## 2016-12-28 DIAGNOSIS — F5105 Insomnia due to other mental disorder: Secondary | ICD-10-CM | POA: Diagnosis not present

## 2016-12-28 DIAGNOSIS — G63 Polyneuropathy in diseases classified elsewhere: Secondary | ICD-10-CM

## 2016-12-28 DIAGNOSIS — I10 Essential (primary) hypertension: Secondary | ICD-10-CM

## 2016-12-28 LAB — CBC WITH DIFFERENTIAL/PLATELET
BASOS ABS: 0.1 10*3/uL (ref 0.0–0.1)
Basophils Relative: 0.9 % (ref 0.0–3.0)
EOS ABS: 0.3 10*3/uL (ref 0.0–0.7)
Eosinophils Relative: 4.2 % (ref 0.0–5.0)
HEMATOCRIT: 41.4 % (ref 39.0–52.0)
Hemoglobin: 14.2 g/dL (ref 13.0–17.0)
LYMPHS PCT: 32.9 % (ref 12.0–46.0)
Lymphs Abs: 2.1 10*3/uL (ref 0.7–4.0)
MCHC: 34.3 g/dL (ref 30.0–36.0)
MCV: 94.2 fl (ref 78.0–100.0)
MONOS PCT: 9.5 % (ref 3.0–12.0)
Monocytes Absolute: 0.6 10*3/uL (ref 0.1–1.0)
NEUTROS PCT: 52.5 % (ref 43.0–77.0)
Neutro Abs: 3.4 10*3/uL (ref 1.4–7.7)
Platelets: 146 10*3/uL — ABNORMAL LOW (ref 150.0–400.0)
RBC: 4.39 Mil/uL (ref 4.22–5.81)
RDW: 13.3 % (ref 11.5–15.5)
WBC: 6.5 10*3/uL (ref 4.0–10.5)

## 2016-12-28 LAB — COMPREHENSIVE METABOLIC PANEL
ALBUMIN: 4.6 g/dL (ref 3.5–5.2)
ALK PHOS: 103 U/L (ref 39–117)
ALT: 20 U/L (ref 0–53)
AST: 18 U/L (ref 0–37)
BILIRUBIN TOTAL: 1 mg/dL (ref 0.2–1.2)
BUN: 13 mg/dL (ref 6–23)
CALCIUM: 9.5 mg/dL (ref 8.4–10.5)
CO2: 30 mEq/L (ref 19–32)
CREATININE: 0.9 mg/dL (ref 0.40–1.50)
Chloride: 102 mEq/L (ref 96–112)
GFR: 87.16 mL/min (ref >60.00)
Glucose, Bld: 99 mg/dL (ref 70–99)
Potassium: 4.8 mEq/L (ref 3.5–5.1)
Sodium: 139 mEq/L (ref 135–145)
TOTAL PROTEIN: 7.3 g/dL (ref 6.0–8.3)

## 2016-12-28 LAB — LIPID PANEL
CHOL/HDL RATIO: 5
CHOLESTEROL: 170 mg/dL (ref 0–200)
HDL: 35.4 mg/dL — AB (ref >39.00)
LDL Cholesterol: 95 mg/dL (ref 0–99)
NonHDL: 134.75
TRIGLYCERIDES: 200 mg/dL — AB (ref 0.0–149.0)
VLDL: 40 mg/dL (ref 0.0–40.0)

## 2016-12-28 LAB — PSA: PSA: 0.64 ng/mL (ref 0.10–4.00)

## 2016-12-28 LAB — TSH: TSH: 1.76 u[IU]/mL (ref 0.35–4.50)

## 2016-12-28 LAB — HEMOGLOBIN A1C: HEMOGLOBIN A1C: 5.7 % (ref 4.6–6.5)

## 2016-12-28 MED ORDER — TIZANIDINE HCL 2 MG PO TABS: 2.0000 mg | ORAL_TABLET | Freq: Three times a day (TID) | ORAL | 3 refills | Status: DC | PRN

## 2016-12-28 MED ORDER — TELMISARTAN 80 MG PO TABS: 80.0000 mg | ORAL_TABLET | Freq: Every day | ORAL | 1 refills | Status: DC

## 2016-12-28 MED ORDER — PREGABALIN 50 MG PO CAPS: 50.0000 mg | ORAL_CAPSULE | Freq: Three times a day (TID) | ORAL | 3 refills | Status: DC

## 2016-12-28 MED ORDER — TRAZODONE HCL 100 MG PO TABS: 100.0000 mg | ORAL_TABLET | Freq: Every day | ORAL | 3 refills | Status: DC

## 2016-12-28 MED ORDER — METOPROLOL TARTRATE 25 MG PO TABS: 25.0000 mg | ORAL_TABLET | Freq: Two times a day (BID) | ORAL | 1 refills | Status: DC

## 2016-12-28 NOTE — Patient Instructions (Signed)

## 2016-12-28 NOTE — Progress Notes (Signed)
Subjective:  Patient ID: Nicholas Buck, male    DOB: 11-Sep-1941  Age: 76 y.o. MRN: [id number]  CC: Annual Exam; Hypertension; and Hyperlipidemia   HPI Nicholas Buck presents for an AWV/CPX.  He complains that  his feet are not getting any better with respect to the neuropathy. 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 He is frustrated that he still has discomfort in his feet that he describes as burning and stinging and says gabapentin has not helped much.  He is also concerned that his blood pressure is not well controlled. Somehow, he has not been taking telmisartan recently. He has had no recent episodes of chest pain or shortness of breath.  Past Medical History:  Diagnosis Date  . Anemia    microcytic  . COPD (chronic obstructive pulmonary disease) ([place])    ?  Marland Kitchen GERD (gastroesophageal reflux disease)   . Hyperlipidemia   . Stroke [facility])    Past Surgical History:  Procedure Laterality Date  . COLONOSCOPY    . KNEE SURGERY     LEFT  . NECK SURGERY     DISC  . POLYPECTOMY      reports that he quit smoking about 4 years ago. His smoking use included Cigarettes. He has a 50.00 pack-year smoking history. He has never used smokeless tobacco. He reports that he does not drink alcohol or use drugs. family history includes Heart disease in his father. Allergies  Allergen Reactions  . Mevacor [Lovastatin]     Muscle aches    Outpatient Medications Prior to Visit  Medication Sig Dispense Refill  . aspirin EC 81 MG tablet Take 1 tablet (81 mg total) by mouth daily. 30 tablet 11  . atorvastatin (LIPITOR) 20 MG tablet TAKE 1 TABLET BY MOUTH EVERY DAY 90 tablet 2  . gabapentin (NEURONTIN) 800 MG tablet TAKE 2 TABLETS(1600 MG) BY MOUTH TWICE DAILY 360 tablet 0  . metoprolol tartrate (LOPRESSOR) 25 MG tablet Take 1 tablet (25 mg total) by mouth  2 (two) times daily. 60 tablet 11  . ferrous sulfate 325 (65 FE) MG tablet Take 325 mg by mouth daily with breakfast.    . azithromycin (ZITHROMAX) 250 MG tablet Take 2 tablets by mouth for 1 day and then 1 tablet by mouth daily for 4 days. 6 tablet 0  . telmisartan (MICARDIS) 80 MG tablet Take 1 tablet (80 mg total) by mouth daily. (Patient not taking: Reported on 12/28/2016) 90 tablet 1  . tiZANidine (ZANAFLEX) 2 MG tablet Take 2 mg by mouth every 8 (eight) hours as needed for muscle spasms.     . traZODone (DESYREL) 100 MG tablet Take 1 tablet (100 mg total) by mouth at bedtime. (Patient not taking: Reported on 12/28/2016) 90 tablet 3   No facility-administered medications prior to visit.     ROS Review of Systems  Constitutional: Negative.  Negative for appetite change, chills, diaphoresis, fatigue and unexpected weight change.  HENT: Negative.   Eyes: Negative for visual disturbance.  Respiratory: Negative for cough, chest tightness, shortness of breath and stridor.   Cardiovascular: Negative for chest pain, palpitations and leg swelling.  Gastrointestinal: Negative for abdominal pain, blood in stool, constipation, diarrhea, nausea and vomiting.  Endocrine: Negative.   Genitourinary: Negative.  Negative for difficulty urinating, frequency, penile pain, penile swelling, scrotal swelling, testicular pain and urgency.  Musculoskeletal:  Negative.  Negative for back pain, joint swelling, myalgias and neck pain.  Skin: Negative.  Negative for color change and rash.  Allergic/Immunologic: Negative.   Neurological: Positive for numbness. Negative for dizziness, weakness and headaches.       He complains of pain and numbness on the plantar side of both feet, this is not worsening but is also not getting any better and he has not noticed much symptom relief with gabapentin.  Hematological: Negative for adenopathy. Does not bruise/bleed easily.  Psychiatric/Behavioral: Positive for sleep disturbance  (FA). Negative for confusion, decreased concentration, dysphoric mood and suicidal ideas. The patient is not nervous/anxious and is not hyperactive.     Objective:  BP (!) 150/90 (BP Location: Left Arm, Patient Position: Sitting, Cuff Size: Large)   Pulse 75   Temp 97.5 F (36.4 C) (Oral)   Resp 16   Ht '6\' 1"'$  (1.854 m)   Wt 194 lb (88 kg)   SpO2 94%   BMI 25.60 kg/m   BP Readings from Last 3 Encounters:  12/28/16 (!) 150/90  12/05/16 118/82  07/25/16 134/66    Wt Readings from Last 3 Encounters:  12/28/16 194 lb (88 kg)  12/05/16 189 lb (85.7 kg)  07/25/16 203 lb 12.8 oz (92.4 kg)    Physical Exam  Constitutional: He is oriented to person, place, and time. He appears well-developed and well-nourished. No distress.  HENT:  Head: Normocephalic and atraumatic.  Mouth/Throat: Oropharynx is clear and moist. No oropharyngeal exudate.  Eyes: Conjunctivae are normal. Right eye exhibits no discharge. Left eye exhibits no discharge. No scleral icterus.  Neck: Normal range of motion. Neck supple. No JVD present. No tracheal deviation present. No thyromegaly present.  Cardiovascular: Normal rate, regular rhythm, normal heart sounds and intact distal pulses.  Exam reveals no gallop and no friction rub.   No murmur heard. Pulmonary/Chest: Effort normal and breath sounds normal. No stridor. No respiratory distress. He has no wheezes. He has no rales. He exhibits no tenderness.  Abdominal: Soft. Bowel sounds are normal. He exhibits no distension and no mass. There is no tenderness. There is no rebound and no guarding. Hernia confirmed negative in the right inguinal area and confirmed negative in the left inguinal area.  Genitourinary: Testes normal and penis normal. Rectal exam shows internal hemorrhoid and guaiac positive stool. Rectal exam shows no external hemorrhoid, no fissure, no mass, no tenderness and anal tone normal. Prostate is enlarged (2+ smooth symm BPH). Prostate is not tender.  Right testis shows no mass, no swelling and no tenderness. Right testis is descended. Left testis shows no mass, no swelling and no tenderness. Uncircumcised. No phimosis, paraphimosis, hypospadias, penile erythema or penile tenderness. No discharge found.  Musculoskeletal: Normal range of motion. He exhibits no edema, tenderness or deformity.  Lymphadenopathy:    He has no cervical adenopathy.       Right: No inguinal adenopathy present.       Left: No inguinal adenopathy present.  Neurological: He is alert and oriented to person, place, and time. He has normal reflexes. He displays normal reflexes. No cranial nerve deficit. He exhibits normal muscle tone. Coordination normal.  Skin: Skin is warm and dry. No rash noted. He is not diaphoretic. No erythema. No pallor.  Psychiatric: He has a normal mood and affect. His behavior is normal. Judgment and thought content normal.  Vitals reviewed.   Lab Results  Component Value Date   WBC 6.5 12/28/2016   HGB 14.2 12/28/2016  HCT 41.4 12/28/2016   PLT 146.0 (L) 12/28/2016   GLUCOSE 99 12/28/2016   CHOL 170 12/28/2016   TRIG 200.0 (H) 12/28/2016   HDL 35.40 (L) 12/28/2016   LDLDIRECT 103.9 07/28/2014   LDLCALC 95 12/28/2016   ALT 20 12/28/2016   AST 18 12/28/2016   NA 139 12/28/2016   K 4.8 12/28/2016   CL 102 12/28/2016   CREATININE 0.90 12/28/2016   BUN 13 12/28/2016   CO2 30 12/28/2016   TSH 1.76 12/28/2016   PSA 0.64 12/28/2016   INR 1.11 09/08/2015   HGBA1C 5.7 12/28/2016    Dg Chest 2 View  Result Date: 12/05/2016 CLINICAL DATA:  Cough, dyspnea on exertion, wheezing for 1 week. Diagnosed with pneumonia and today. History of COPD. EXAM: CHEST  2 VIEW COMPARISON:  Chest radiograph January 24, 2013 FINDINGS: Cardiomediastinal silhouette is normal, calcified aortic knob. Scattered calcified granulomata. Increased lung volumes with coarsened interstitium in the lung bases, superimposed bibasilar airspace opacities. No pleural  effusion. No pneumothorax. Soft tissue planes and included osseous structures are nonsuspicious. IMPRESSION: COPD. Superimposed bibasilar suspected pneumonia. Followup PA and lateral chest X-ray is recommended in 3-4 weeks following trial of antibiotic therapy to ensure resolution and exclude underlying malignancy. Electronically Signed   By: Elon Alas M.D.   On: 12/05/2016 16:53    Assessment & Plan:   Mostafa was seen today for annual exam, hypertension and hyperlipidemia.  Diagnoses and all orders for this visit:  Essential hypertension- his blood pressure is not adequately well controlled. Will restart the ARB. His electrolytes and renal function are normal. -     telmisartan (MICARDIS) 80 MG tablet; Take 1 tablet (80 mg total) by mouth daily. -     metoprolol tartrate (LOPRESSOR) 25 MG tablet; Take 1 tablet (25 mg total) by mouth 2 (two) times daily. -     Comprehensive metabolic panel; Future  Vitamin B12 deficiency neuropathy ([place])- will continue B12 replacement therapy. -     CBC with Differential/Platelet; Future  Dyslipidemia, goal LDL below 70- he has achieved his LDL goal is doing well on the statin. -     Lipid panel; Future -     TSH; Future  Hyperglycemia- his blood sugars have improved with lifestyle modifications. 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 Will continue with risk factor modifications with statin therapy, aspirin therapy, and blood pressure control. -     telmisartan (MICARDIS) 80 MG tablet; Take 1 tablet (80 mg total) by mouth daily.  Occult blood in stools- I've asked him to see gastroenterology to consider evaluation for gastrointestinal bleeding. -     Ambulatory referral to Gastroenterology -     CBC  with Differential/Platelet; Future  Hereditary and idiopathic peripheral neuropathy- will try to improve symptom relief by changing gabapentin to Lyrica. Will continue the muscle relaxer as needed and I have also asked him to see neurology. -     Ambulatory referral to Neurology -     pregabalin (LYRICA) 50 MG capsule; Take 1 capsule (50 mg total) by mouth 3 (three) times daily. -     tiZANidine (ZANAFLEX) 2 MG tablet; Take 1 tablet (2 mg total) by mouth every 8 (eight) hours as needed for muscle spasms.  Benign prostatic hyperplasia without lower urinary tract symptoms- he has no symptoms that need to be treated and his PSA is normal so I am not concerned about prostate cancer. -     PSA; Future  Insomnia due to anxiety and fear -     traZODone (DESYREL) 100 MG tablet; Take 1 tablet (100 mg total) by mouth at bedtime.   I have discontinued Nicholas Buck gabapentin and azithromycin. I have also changed his tiZANidine. Additionally, I am having him start on pregabalin. Lastly, I am having him maintain his aspirin EC, ferrous sulfate, atorvastatin, telmisartan, metoprolol tartrate, and traZODone.  Meds ordered this encounter  Medications  . telmisartan (MICARDIS) 80 MG tablet    Sig: Take 1 tablet (80 mg total) by mouth daily.    Dispense:  90 tablet    Refill:  1  . metoprolol tartrate (LOPRESSOR) 25 MG tablet    Sig: Take 1 tablet (25 mg total) by mouth 2 (two) times daily.    Dispense:  180 tablet    Refill:  1  . pregabalin (LYRICA) 50 MG capsule    Sig: Take 1 capsule (50 mg total) by mouth 3 (three) times daily.    Dispense:  90 capsule    Refill:  3  . traZODone (DESYREL) 100 MG tablet    Sig: Take 1 tablet (100 mg total) by mouth at bedtime.    Dispense:  90 tablet    Refill:  3  . tiZANidine (ZANAFLEX) 2 MG tablet    Sig: Take 1 tablet (2 mg total) by mouth every 8 (eight) hours as needed for muscle spasms.    Dispense:  60 tablet    Refill:  3   See AVS for instructions  about healthy living and anticipatory guidance.  Follow-up: Return in about 3 months (around 03/27/2017).  Scarlette Calico, MD

## 2016-12-28 NOTE — Telephone Encounter (Signed)
Rec'd call from pt he states he ask for a refill on the Tizanidine, but med was taken off. Pt requesting refill to be sent to [facility]...Johny Chess

## 2016-12-28 NOTE — Telephone Encounter (Signed)
done

## 2016-12-28 NOTE — Progress Notes (Signed)
Pre visit review using our clinic review tool, if applicable. No additional management support is needed unless otherwise documented below in the visit note. 

## 2016-12-29 NOTE — Telephone Encounter (Signed)
Notified pt refill has been sent to [facility]...Johny Chess

## 2016-12-31 NOTE — Assessment & Plan Note (Signed)

## 2017-01-24 ENCOUNTER — Ambulatory Visit (INDEPENDENT_AMBULATORY_CARE_PROVIDER_SITE_OTHER): Payer: Medicare Other

## 2017-01-24 DIAGNOSIS — E538 Deficiency of other specified B group vitamins: Secondary | ICD-10-CM

## 2017-01-24 MED ORDER — CYANOCOBALAMIN 1000 MCG/ML IJ SOLN
1000.0000 ug | Freq: Once | INTRAMUSCULAR | Status: AC
  Administered 2017-01-24: 1000 ug via INTRAMUSCULAR

## 2017-02-07 ENCOUNTER — Encounter: Payer: Self-pay | Admitting: Internal Medicine

## 2017-02-07 ENCOUNTER — Ambulatory Visit (INDEPENDENT_AMBULATORY_CARE_PROVIDER_SITE_OTHER): Payer: Medicare Other | Admitting: Internal Medicine

## 2017-02-07 ENCOUNTER — Other Ambulatory Visit: Payer: Self-pay | Admitting: Internal Medicine

## 2017-02-07 ENCOUNTER — Telehealth: Payer: Self-pay | Admitting: Internal Medicine

## 2017-02-07 VITALS — BP 170/80 | HR 64 | Ht 73.0 in | Wt 201.0 lb

## 2017-02-07 DIAGNOSIS — D500 Iron deficiency anemia secondary to blood loss (chronic): Secondary | ICD-10-CM

## 2017-02-07 DIAGNOSIS — G63 Polyneuropathy in diseases classified elsewhere: Secondary | ICD-10-CM

## 2017-02-07 DIAGNOSIS — G609 Hereditary and idiopathic neuropathy, unspecified: Secondary | ICD-10-CM

## 2017-02-07 DIAGNOSIS — K5901 Slow transit constipation: Secondary | ICD-10-CM

## 2017-02-07 DIAGNOSIS — R195 Other fecal abnormalities: Secondary | ICD-10-CM

## 2017-02-07 DIAGNOSIS — E538 Deficiency of other specified B group vitamins: Secondary | ICD-10-CM

## 2017-02-07 DIAGNOSIS — Z86010 Personal history of colonic polyps: Secondary | ICD-10-CM | POA: Diagnosis not present

## 2017-02-07 MED ORDER — NA SULFATE-K SULFATE-MG SULF 17.5-3.13-1.6 GM/177ML PO SOLN: 1.0000 | Freq: Once | ORAL | 0 refills | Status: AC

## 2017-02-07 MED ORDER — GABAPENTIN 300 MG PO CAPS: 300.0000 mg | ORAL_CAPSULE | Freq: Three times a day (TID) | ORAL | 11 refills | Status: DC

## 2017-02-07 NOTE — Patient Instructions (Signed)
Take one tablespoon of Metamucil daily and make sure to resume your iron  You have been scheduled for a colonoscopy. Please follow written instructions given to you at your visit today.  Please pick up your prep supplies at the pharmacy within the next 1-3 days. If you use inhalers (even only as needed), please bring them with you on the day of your procedure. Your physician has requested that you go to www.startemmi.com and enter the access code given to you at your visit today. This web site gives a general overview about your procedure. However, you should still follow specific instructions given to you by our office regarding your preparation for the procedure.

## 2017-02-07 NOTE — Progress Notes (Signed)
HISTORY OF PRESENT ILLNESS:  Nicholas Buck is a 76 y.o. male who is sent today by his primary care provider Dr. Ronnald Ramp regarding a chief complaint of Hemoccult-positive stool identified on digital rectal exam 12/28/2016 as part of his annual comprehensive evaluation. I saw the patient in the office 02/05/2013 regarding iron deficiency anemia and Hemoccult-positive stool. SEE THAT DICTATION. He had prior colonoscopies elsewhere as described for polyps. Also EGD elsewhere. 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 I performed colonoscopy and upper endoscopy on the patient 02/26/2013. Colonoscopy revealed diminutive colon polyps and moderate left-sided diverticulosis. One polyp was adenomatous. Follow-up in 5 years recommended. Upper endoscopy was normal. He was told to continue iron and have his blood counts monitored periodically. On iron therapy has had minimal blood counts consistently. He does tell me that he stop iron about 3 months ago stating that it possibly caused diarrhea. Other complaints today is straining to defecate and increased intestinal gas. No bleeding. No abdominal pain. No reflux symptoms.  REVIEW OF SYSTEMS:  All non-GI ROS negative except for anxiety, sleeping problems, excessive urination  Past Medical History:  Diagnosis Date  . Anemia    microcytic  . COPD (chronic obstructive pulmonary disease) ([place])    ?  Marland Kitchen GERD (gastroesophageal reflux disease)   . Hyperlipidemia   . Stroke [facility])     Past Surgical History:  Procedure Laterality Date  . COLONOSCOPY    . KNEE SURGERY     LEFT  . NECK SURGERY     DISC  . POLYPECTOMY      Social History Nicholas Buck  reports that he quit smoking about 4 years ago. His smoking use included Cigarettes. He has a 50.00 pack-year smoking history. He has never used smokeless tobacco. He reports that he does not drink alcohol or use drugs.  family history includes Heart disease in his father.  Allergies  Allergen  Reactions  . Mevacor [Lovastatin]     Muscle aches       PHYSICAL EXAMINATION: Vital signs: BP (!) 170/80   Pulse 64   Ht '6\' 1"'$  (1.854 m)   Wt 201 lb (91.2 kg)   BMI 26.52 kg/m   Constitutional: generally well-appearing, no acute distress Psychiatric: alert and oriented x3, cooperative Eyes: extraocular movements intact, anicteric, but hemorrhage and left eye secondary to trauma, conjunctiva pink Mouth: oral pharynx moist, no lesions Neck: supple no lymphadenopathy Cardiovascular: heart regular rate and rhythm, no murmur Lungs: clear to auscultation bilaterally Abdomen: soft, nontender, nondistended, no obvious ascites, no peritoneal signs, normal bowel sounds, no organomegaly Rectal: Deferred until colonoscopy Extremities: no clubbing cyanosis or lower extremity edema bilaterally Skin: no lesions on visible extremities Neuro: No focal deficits. Cranial nerves intact  ASSESSMENT:  #1. Hemoccult-positive stool of uncertain clinical significance found on digital rectal exam #2. History of hyperplastic and adenomatous colon polyps. Last colonoscopy 2014 #3. History of cecal AVM ablated elsewhere #4. History of iron deficiency anemia. Possibly secondary to AVMs. Has been able to maintain normal blood counts on iron. Recently stopped iron as described #5. Mild constipation #6. Increased intestinal gas   PLAN:  #1. Schedule colonoscopy to evaluate Hemoccult-positive stool and provide polyp surveillance 1 year earlier than originally planned.The nature of the procedure, as well as the risks, benefits, and alternatives were carefully and thoroughly reviewed with the patient. Ample time for discussion and questions allowed. The patient understood, was satisfied, and agreed to proceed. #2. Instructed to resume iron therapy #3.  Continue periodic monitoring of CBC with PCP #4. Recommended Metamucil one to 2 tablespoons daily for bowel irregularity #5. Discussed increased intestinal  gas. No specific recommendations at this time. Could consider probiotic  A copy of this consultation note has been sent to Dr. Ronnald Ramp

## 2017-02-07 NOTE — Telephone Encounter (Signed)
Pt came in stating pregabalin 50 is not working for him, he req to be put back on gabapentin. Please advise.   [facility] on [address] is pharmacy

## 2017-02-08 NOTE — Telephone Encounter (Signed)
Patient is calling again about this. Please advise.

## 2017-02-08 NOTE — Telephone Encounter (Addendum)
Pt informed that this has been sent via voicemail.

## 2017-02-21 ENCOUNTER — Telehealth: Payer: Self-pay | Admitting: Internal Medicine

## 2017-02-21 ENCOUNTER — Ambulatory Visit (INDEPENDENT_AMBULATORY_CARE_PROVIDER_SITE_OTHER): Payer: Medicare Other

## 2017-02-21 ENCOUNTER — Other Ambulatory Visit (INDEPENDENT_AMBULATORY_CARE_PROVIDER_SITE_OTHER): Payer: Medicare Other

## 2017-02-21 DIAGNOSIS — E538 Deficiency of other specified B group vitamins: Secondary | ICD-10-CM | POA: Diagnosis not present

## 2017-02-21 LAB — VITAMIN B12: Vitamin B-12: 316 pg/mL (ref 211–911)

## 2017-02-21 MED ORDER — CYANOCOBALAMIN 1000 MCG/ML IJ SOLN
1000.0000 ug | Freq: Once | INTRAMUSCULAR | Status: AC
  Administered 2017-02-21: 1000 ug via INTRAMUSCULAR

## 2017-02-21 NOTE — Telephone Encounter (Signed)
Need to check his B12 lab first before give him his injection/ per Dr. Ronnald Ramp, order enter. Pt aware.

## 2017-03-22 ENCOUNTER — Other Ambulatory Visit: Payer: Self-pay | Admitting: Internal Medicine

## 2017-03-26 ENCOUNTER — Ambulatory Visit (INDEPENDENT_AMBULATORY_CARE_PROVIDER_SITE_OTHER): Payer: Medicare Other

## 2017-03-26 DIAGNOSIS — E538 Deficiency of other specified B group vitamins: Secondary | ICD-10-CM

## 2017-03-26 MED ORDER — CYANOCOBALAMIN 1000 MCG/ML IJ SOLN
1000.0000 ug | Freq: Once | INTRAMUSCULAR | Status: AC
  Administered 2017-03-26: 1000 ug via INTRAMUSCULAR

## 2017-03-27 ENCOUNTER — Encounter: Payer: Self-pay | Admitting: Internal Medicine

## 2017-04-10 ENCOUNTER — Encounter: Payer: Self-pay | Admitting: Internal Medicine

## 2017-04-10 ENCOUNTER — Ambulatory Visit (AMBULATORY_SURGERY_CENTER): Payer: Medicare Other | Admitting: Internal Medicine

## 2017-04-10 VITALS — BP 143/69 | HR 60 | Temp 98.0°F | Resp 12 | Ht 73.0 in | Wt 201.0 lb

## 2017-04-10 DIAGNOSIS — Z8673 Personal history of transient ischemic attack (TIA), and cerebral infarction without residual deficits: Secondary | ICD-10-CM | POA: Diagnosis not present

## 2017-04-10 DIAGNOSIS — R195 Other fecal abnormalities: Secondary | ICD-10-CM | POA: Diagnosis present

## 2017-04-10 DIAGNOSIS — Z860100 Personal history of colon polyps, unspecified: Secondary | ICD-10-CM

## 2017-04-10 DIAGNOSIS — D120 Benign neoplasm of cecum: Secondary | ICD-10-CM

## 2017-04-10 DIAGNOSIS — D123 Benign neoplasm of transverse colon: Secondary | ICD-10-CM | POA: Diagnosis not present

## 2017-04-10 MED ORDER — SODIUM CHLORIDE 0.9 % IV SOLN: 500.0000 mL | INTRAVENOUS | Status: DC

## 2017-04-10 NOTE — Patient Instructions (Signed)
YOU HAD AN ENDOSCOPIC PROCEDURE TODAY AT [facility]:   Refer to the procedure report that was given to you for any specific questions about what was found during the examination.  If the procedure report does not answer your questions, please call your gastroenterologist to clarify.  If you requested that your care partner not be given the details of your procedure findings, then the procedure report has been included in a sealed envelope for you to review at your convenience later.  YOU SHOULD EXPECT: Some feelings of bloating in the abdomen. Passage of more gas than usual.  Walking can help get rid of the air that was put into your GI tract during the procedure and reduce the bloating. If you had a lower endoscopy (such as a colonoscopy or flexible sigmoidoscopy) you may notice spotting of blood in your stool or on the toilet paper. If you underwent a bowel prep for your procedure, you may not have a normal bowel movement for a few days.  Please Note:  You might notice some irritation and congestion in your nose or some drainage.  This is from the oxygen used during your procedure.  There is no need for concern and it should clear up in a day or so.  SYMPTOMS TO REPORT IMMEDIATELY:   Following lower endoscopy (colonoscopy or flexible sigmoidoscopy):  Excessive amounts of blood in the stool  Significant tenderness or worsening of abdominal pains  Swelling of the abdomen that is new, acute  Fever of 100F or higher   For urgent or emergent issues, a gastroenterologist can be reached at any hour by calling [phone].   DIET:  We do recommend a small meal at first, but then you may proceed to your regular diet.  Drink plenty of fluids but you should avoid alcoholic beverages for 24 hours. Try to increase the fiber in your diet, and drink plenty of water.  ACTIVITY:  You should plan to take it easy for the rest of today and you should NOT DRIVE or use heavy machinery until  tomorrow (because of the sedation medicines used during the test).    FOLLOW UP: Our staff will call the number listed on your records the next business day following your procedure to check on you and address any questions or concerns that you may have regarding the information given to you following your procedure. If we do not reach you, we will leave a message.  However, if you are feeling well and you are not experiencing any problems, there is no need to return our call.  We will assume that you have returned to your regular daily activities without incident.  If any biopsies were taken you will be contacted by phone or by letter within the next 1-3 weeks.  Please call us at [phone] if you have not heard about the biopsies in 3 weeks.    SIGNATURES/CONFIDENTIALITY: You and/or your care partner have signed paperwork which will be entered into your electronic medical record.  These signatures attest to the fact that that the information above on your After Visit Summary has been reviewed and is understood.  Full responsibility of the confidentiality of this discharge information lies with you and/or your care-partner.  REad all of the handouts given to you by your recovery room nurse.

## 2017-04-10 NOTE — Progress Notes (Signed)
To recovery, report to Hodges, RN, VSS 

## 2017-04-10 NOTE — Progress Notes (Signed)
Called to room to assist during endoscopic procedure.  Patient ID and intended procedure confirmed with present staff. Received instructions for my participation in the procedure from the performing physician.  

## 2017-04-10 NOTE — Op Note (Signed)
[facility] Patient Name: Nicholas Buck Procedure Date: 04/10/2017 4:18 PM MRN: [id number] Endoscopist: Docia Chuck. Henrene Pastor , MD Age: 76 Referring MD:  Date of Birth: 08/09/1941 Gender: Male Account #: [id number] Procedure:                Colonoscopy, with cold snare polypectomy X2 Indications:              Heme positive stool. History of adenomatous colon                            polyps elsewhere and here. Previous examinations                            2008, 2009, 2010, in 2014 Medicines:                Monitored Anesthesia Care Procedure:                Pre-Anesthesia Assessment:                           - Prior to the procedure, a History and Physical                            was performed, and patient medications and                            allergies were reviewed. The patient's tolerance of                            previous anesthesia was also reviewed. The risks                            and benefits of the procedure and the sedation                            options and risks were discussed with the patient.                            All questions were answered, and informed consent                            was obtained. Prior Anticoagulants: The patient has                            taken no previous anticoagulant or antiplatelet                            agents. ASA Grade Assessment: II - A patient with                            mild systemic disease. After reviewing the risks                            and benefits, the patient was deemed in  satisfactory condition to undergo the procedure.                           After obtaining informed consent, the colonoscope                            was passed under direct vision. Throughout the                            procedure, the patient's blood pressure, pulse, and                            oxygen saturations were monitored continuously. The   Colonoscope was introduced through the anus and                            advanced to the the cecum, identified by                            appendiceal orifice and ileocecal valve. The                            ileocecal valve, appendiceal orifice, and rectum                            were photographed. The quality of the bowel                            preparation was good. The colonoscopy was performed                            without difficulty. The patient tolerated the                            procedure well. The bowel preparation used was                            SUPREP. Scope In: 4:29:56 PM Scope Out: 4:43:21 PM Scope Withdrawal Time: 0 hours 10 minutes 35 seconds  Total Procedure Duration: 0 hours 13 minutes 25 seconds  Findings:                 Two polyps were found in the transverse colon and                            cecum. The polyps were 3 mm in size. These polyps                            were removed with a cold snare. Resection and                            retrieval were complete.                           Multiple small and large-mouthed diverticula were  found in the left colon.                           Internal hemorrhoids were found during retroflexion.                           The exam was otherwise without abnormality on                            direct and retroflexion views. Complications:            No immediate complications. Estimated blood loss:                            None. Estimated Blood Loss:     Estimated blood loss: none. Impression:               - Two 3 mm polyps in the transverse colon and in                            the cecum, removed with a cold snare. Resected and                            retrieved.                           - Diverticulosis in the left colon.                           - Internal hemorrhoids.                           - The examination was otherwise normal on direct                             and retroflexion views. Recommendation:           - Repeat colonoscopy in 5 years for surveillance.                           - Patient has a contact number available for                            emergencies. The signs and symptoms of potential                            delayed complications were discussed with the                            patient. Return to normal activities tomorrow.                            Written discharge instructions were provided to the                            patient.                           -  Resume previous diet.                           - Continue present medications.                           - Await pathology results. Docia Chuck. Henrene Pastor, MD 04/10/2017 4:47:55 PM This report has been signed electronically.

## 2017-04-11 ENCOUNTER — Telehealth: Payer: Self-pay | Admitting: *Deleted

## 2017-04-11 NOTE — Telephone Encounter (Signed)
No answer, left message to call if questions or concerns. 

## 2017-04-14 LAB — HM COLONOSCOPY

## 2017-04-16 ENCOUNTER — Encounter: Payer: Self-pay | Admitting: Internal Medicine

## 2017-04-25 ENCOUNTER — Ambulatory Visit (INDEPENDENT_AMBULATORY_CARE_PROVIDER_SITE_OTHER): Payer: Medicare Other

## 2017-04-25 ENCOUNTER — Telehealth: Payer: Self-pay

## 2017-04-25 DIAGNOSIS — E538 Deficiency of other specified B group vitamins: Secondary | ICD-10-CM

## 2017-04-25 MED ORDER — CYANOCOBALAMIN 1000 MCG/ML IJ SOLN
1000.0000 ug | Freq: Once | INTRAMUSCULAR | Status: AC
  Administered 2017-04-25: 1000 ug via INTRAMUSCULAR

## 2017-04-25 NOTE — Telephone Encounter (Signed)
error 

## 2017-05-05 IMAGING — NM NM MISC PROCEDURE
3 series · 18 of 18 positions shown · non-contrast
Comparison: none

[Series 1: stress-sum-em_(id)_sa · 6.4mm · 6.40mm/px · 6 of 64 frames shown]
[frame 6/64]
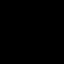
[frame 16/64]
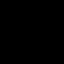
[frame 27/64]
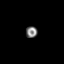
[frame 38/64]
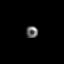
[frame 48/64]
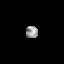
[frame 59/64]
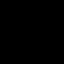

[Series 1: rest_(id)_sa · 6.4mm · 6.40mm/px · 6 of 64 frames shown]
[frame 6/64]
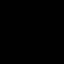
[frame 16/64]
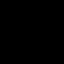
[frame 27/64]
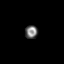
[frame 38/64]
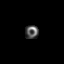
[frame 48/64]
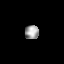
[frame 59/64]
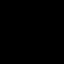

[Series 1: stress-gsp_(id)_sa · 6.4mm · 6.40mm/px · 6 of 512 frames shown]
[frame 43/512]
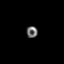
[frame 128/512]
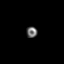
[frame 214/512]
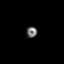
[frame 299/512]
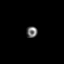
[frame 384/512]
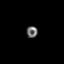
[frame 470/512]
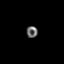

[18 of 18 positions shown; findings below may reference images not displayed]

Canned report from images found in remote index.

Refer to host system for actual result text.

## 2017-05-25 ENCOUNTER — Ambulatory Visit (INDEPENDENT_AMBULATORY_CARE_PROVIDER_SITE_OTHER): Payer: Medicare Other

## 2017-05-25 DIAGNOSIS — E538 Deficiency of other specified B group vitamins: Secondary | ICD-10-CM

## 2017-05-25 DIAGNOSIS — G63 Polyneuropathy in diseases classified elsewhere: Secondary | ICD-10-CM | POA: Diagnosis not present

## 2017-05-25 MED ORDER — CYANOCOBALAMIN 1000 MCG/ML IJ SOLN
1000.0000 ug | Freq: Once | INTRAMUSCULAR | Status: AC
  Administered 2017-05-25: 1000 ug via INTRAMUSCULAR

## 2017-05-28 ENCOUNTER — Other Ambulatory Visit: Payer: Self-pay | Admitting: Internal Medicine

## 2017-05-28 DIAGNOSIS — I6302 Cerebral infarction due to thrombosis of basilar artery: Secondary | ICD-10-CM

## 2017-05-28 DIAGNOSIS — I10 Essential (primary) hypertension: Secondary | ICD-10-CM

## 2017-06-26 ENCOUNTER — Ambulatory Visit (INDEPENDENT_AMBULATORY_CARE_PROVIDER_SITE_OTHER): Payer: Medicare Other

## 2017-06-26 DIAGNOSIS — G63 Polyneuropathy in diseases classified elsewhere: Secondary | ICD-10-CM | POA: Diagnosis not present

## 2017-06-26 DIAGNOSIS — E538 Deficiency of other specified B group vitamins: Secondary | ICD-10-CM

## 2017-06-26 MED ORDER — CYANOCOBALAMIN 1000 MCG/ML IJ SOLN
1000.0000 ug | Freq: Once | INTRAMUSCULAR | Status: AC
  Administered 2017-06-26: 1000 ug via INTRAMUSCULAR

## 2017-07-08 NOTE — Progress Notes (Signed)
I was available to supervise the injection. A. Ahyan Kreeger, MD  

## 2017-07-27 ENCOUNTER — Ambulatory Visit (INDEPENDENT_AMBULATORY_CARE_PROVIDER_SITE_OTHER): Payer: Medicare Other | Admitting: Emergency Medicine

## 2017-07-27 DIAGNOSIS — G63 Polyneuropathy in diseases classified elsewhere: Secondary | ICD-10-CM | POA: Diagnosis not present

## 2017-07-27 DIAGNOSIS — E538 Deficiency of other specified B group vitamins: Secondary | ICD-10-CM | POA: Diagnosis not present

## 2017-07-27 MED ORDER — CYANOCOBALAMIN 1000 MCG/ML IJ SOLN
1000.0000 ug | Freq: Once | INTRAMUSCULAR | Status: AC
  Administered 2017-07-27: 1000 ug via INTRAMUSCULAR

## 2017-08-27 ENCOUNTER — Ambulatory Visit (INDEPENDENT_AMBULATORY_CARE_PROVIDER_SITE_OTHER): Payer: Medicare Other

## 2017-08-27 DIAGNOSIS — E538 Deficiency of other specified B group vitamins: Secondary | ICD-10-CM | POA: Diagnosis not present

## 2017-08-27 MED ORDER — CYANOCOBALAMIN 1000 MCG/ML IJ SOLN
1000.0000 ug | Freq: Once | INTRAMUSCULAR | Status: AC
  Administered 2017-08-27: 1000 ug via INTRAMUSCULAR

## 2017-08-27 NOTE — Progress Notes (Signed)
Injection given.   Amahia Madonia J Latia Mataya, MD  

## 2017-09-15 ENCOUNTER — Other Ambulatory Visit: Payer: Self-pay | Admitting: Internal Medicine

## 2017-09-15 DIAGNOSIS — I10 Essential (primary) hypertension: Secondary | ICD-10-CM

## 2017-09-17 ENCOUNTER — Telehealth: Payer: Self-pay | Admitting: Internal Medicine

## 2017-09-17 NOTE — Telephone Encounter (Signed)
Can you please follow up in regard to this one, since he had other questions in regard to other vacs and what they are for?

## 2017-09-17 NOTE — Telephone Encounter (Signed)
Patient requesting call back.  Would like to know what pneumonia injection he is to get next.  Also has questions in regards to other vacs.  And would like to get flu shot on next b12 OV.

## 2017-09-17 NOTE — Telephone Encounter (Signed)
Pt is up to date on pneumonia shot for a while.  Shingles is recommended.  We are happy to do the flu vaccine when he comes in for his b12.  There should be at least a 2 week space between the shingles and any other vaccine.   We can send to the pharmacy if he would like for [place] to.

## 2017-09-18 NOTE — Telephone Encounter (Signed)
Pt informed of my notes below. Pt stated understanding and will get his flu vaccine at the time of his next b12.

## 2017-09-25 ENCOUNTER — Other Ambulatory Visit: Payer: Self-pay | Admitting: Internal Medicine

## 2017-09-25 DIAGNOSIS — I6302 Cerebral infarction due to thrombosis of basilar artery: Secondary | ICD-10-CM

## 2017-09-25 DIAGNOSIS — I10 Essential (primary) hypertension: Secondary | ICD-10-CM

## 2017-09-27 ENCOUNTER — Ambulatory Visit (INDEPENDENT_AMBULATORY_CARE_PROVIDER_SITE_OTHER): Payer: Medicare Other | Admitting: General Practice

## 2017-09-27 DIAGNOSIS — E538 Deficiency of other specified B group vitamins: Secondary | ICD-10-CM | POA: Diagnosis not present

## 2017-09-27 DIAGNOSIS — Z23 Encounter for immunization: Secondary | ICD-10-CM

## 2017-09-27 MED ORDER — CYANOCOBALAMIN 1000 MCG/ML IJ SOLN
1000.0000 ug | Freq: Once | INTRAMUSCULAR | Status: AC
  Administered 2017-09-27: 1000 ug via INTRAMUSCULAR

## 2017-10-24 ENCOUNTER — Other Ambulatory Visit: Payer: Self-pay | Admitting: *Deleted

## 2017-10-24 ENCOUNTER — Other Ambulatory Visit: Payer: Self-pay | Admitting: Internal Medicine

## 2017-10-24 ENCOUNTER — Telehealth: Payer: Self-pay | Admitting: Internal Medicine

## 2017-10-24 DIAGNOSIS — I6302 Cerebral infarction due to thrombosis of basilar artery: Secondary | ICD-10-CM

## 2017-10-24 DIAGNOSIS — I10 Essential (primary) hypertension: Secondary | ICD-10-CM

## 2017-10-24 MED ORDER — TELMISARTAN 80 MG PO TABS: ORAL_TABLET | ORAL | 0 refills | Status: DC

## 2017-10-24 NOTE — Telephone Encounter (Signed)
Copied from [place] [id number]. Topic: General - Other >> Oct 24, 2017  3:26 PM Darl Householder, RMA wrote: Reason for CRM: patient is requesting medication refill for telmisartan 80 mg to be sent to [facility] [place], please call pt once medication has been sent to pharmacy

## 2017-10-26 ENCOUNTER — Ambulatory Visit (INDEPENDENT_AMBULATORY_CARE_PROVIDER_SITE_OTHER): Payer: Medicare Other

## 2017-10-26 DIAGNOSIS — E538 Deficiency of other specified B group vitamins: Secondary | ICD-10-CM

## 2017-10-26 MED ORDER — CYANOCOBALAMIN 1000 MCG/ML IJ SOLN
1000.0000 ug | Freq: Once | INTRAMUSCULAR | Status: AC
  Administered 2017-10-26: 1000 ug via INTRAMUSCULAR

## 2017-10-26 NOTE — Progress Notes (Signed)
b12 Injection given.   Nicholas Dix J Eleana Tocco, MD  

## 2017-11-26 ENCOUNTER — Ambulatory Visit: Payer: Medicare Other

## 2017-12-03 ENCOUNTER — Ambulatory Visit (INDEPENDENT_AMBULATORY_CARE_PROVIDER_SITE_OTHER): Payer: Medicare Other

## 2017-12-03 DIAGNOSIS — E538 Deficiency of other specified B group vitamins: Secondary | ICD-10-CM

## 2017-12-03 MED ORDER — CYANOCOBALAMIN 1000 MCG/ML IJ SOLN
1000.0000 ug | Freq: Once | INTRAMUSCULAR | Status: AC
  Administered 2017-12-03: 1000 ug via INTRAMUSCULAR

## 2017-12-17 ENCOUNTER — Other Ambulatory Visit: Payer: Self-pay | Admitting: Internal Medicine

## 2017-12-17 DIAGNOSIS — I10 Essential (primary) hypertension: Secondary | ICD-10-CM

## 2017-12-24 ENCOUNTER — Other Ambulatory Visit: Payer: Self-pay | Admitting: Internal Medicine

## 2017-12-24 DIAGNOSIS — F409 Phobic anxiety disorder, unspecified: Secondary | ICD-10-CM

## 2017-12-24 DIAGNOSIS — F5105 Insomnia due to other mental disorder: Principal | ICD-10-CM

## 2018-01-01 ENCOUNTER — Other Ambulatory Visit: Payer: Self-pay | Admitting: Internal Medicine

## 2018-01-01 DIAGNOSIS — G609 Hereditary and idiopathic neuropathy, unspecified: Secondary | ICD-10-CM

## 2018-01-02 ENCOUNTER — Ambulatory Visit (INDEPENDENT_AMBULATORY_CARE_PROVIDER_SITE_OTHER): Payer: Medicare Other

## 2018-01-02 DIAGNOSIS — E538 Deficiency of other specified B group vitamins: Secondary | ICD-10-CM

## 2018-01-02 MED ORDER — CYANOCOBALAMIN 1000 MCG/ML IJ SOLN
1000.0000 ug | Freq: Once | INTRAMUSCULAR | Status: AC
  Administered 2018-01-02: 1000 ug via INTRAMUSCULAR

## 2018-01-02 NOTE — Progress Notes (Signed)
b12 Injection given.   Mrytle Bento J Joretta Eads, MD  

## 2018-01-17 ENCOUNTER — Other Ambulatory Visit: Payer: Self-pay | Admitting: Internal Medicine

## 2018-01-17 DIAGNOSIS — I10 Essential (primary) hypertension: Secondary | ICD-10-CM

## 2018-01-18 ENCOUNTER — Other Ambulatory Visit: Payer: Self-pay | Admitting: Internal Medicine

## 2018-01-18 DIAGNOSIS — G609 Hereditary and idiopathic neuropathy, unspecified: Secondary | ICD-10-CM

## 2018-01-18 DIAGNOSIS — E538 Deficiency of other specified B group vitamins: Secondary | ICD-10-CM

## 2018-01-18 DIAGNOSIS — G63 Polyneuropathy in diseases classified elsewhere: Secondary | ICD-10-CM

## 2018-01-18 NOTE — Telephone Encounter (Signed)
Pt has not seen PCP since 2018. He will need to be seen before we can send in refills.

## 2018-01-18 NOTE — Telephone Encounter (Signed)
Left message for patient to call back to schedule.  °

## 2018-01-29 ENCOUNTER — Other Ambulatory Visit: Payer: Self-pay | Admitting: Internal Medicine

## 2018-01-29 DIAGNOSIS — E538 Deficiency of other specified B group vitamins: Secondary | ICD-10-CM

## 2018-01-29 DIAGNOSIS — G609 Hereditary and idiopathic neuropathy, unspecified: Secondary | ICD-10-CM

## 2018-01-29 DIAGNOSIS — G63 Polyneuropathy in diseases classified elsewhere: Secondary | ICD-10-CM

## 2018-01-30 ENCOUNTER — Ambulatory Visit: Payer: Medicare Other

## 2018-02-16 ENCOUNTER — Other Ambulatory Visit: Payer: Self-pay | Admitting: Internal Medicine

## 2018-02-16 DIAGNOSIS — I10 Essential (primary) hypertension: Secondary | ICD-10-CM

## 2018-02-25 ENCOUNTER — Encounter: Payer: Self-pay | Admitting: Internal Medicine

## 2018-02-25 ENCOUNTER — Other Ambulatory Visit (INDEPENDENT_AMBULATORY_CARE_PROVIDER_SITE_OTHER): Payer: Medicare Other

## 2018-02-25 ENCOUNTER — Ambulatory Visit (INDEPENDENT_AMBULATORY_CARE_PROVIDER_SITE_OTHER): Payer: Medicare Other | Admitting: Internal Medicine

## 2018-02-25 VITALS — BP 122/72 | HR 73 | Temp 98.1°F | Resp 16 | Ht 73.0 in | Wt 189.0 lb

## 2018-02-25 DIAGNOSIS — I10 Essential (primary) hypertension: Secondary | ICD-10-CM

## 2018-02-25 DIAGNOSIS — E785 Hyperlipidemia, unspecified: Secondary | ICD-10-CM

## 2018-02-25 DIAGNOSIS — Z0000 Encounter for general adult medical examination without abnormal findings: Secondary | ICD-10-CM

## 2018-02-25 DIAGNOSIS — E538 Deficiency of other specified B group vitamins: Secondary | ICD-10-CM | POA: Diagnosis not present

## 2018-02-25 LAB — COMPREHENSIVE METABOLIC PANEL
ALBUMIN: 4.1 g/dL (ref 3.5–5.2)
ALK PHOS: 98 U/L (ref 39–117)
ALT: 12 U/L (ref 0–53)
AST: 13 U/L (ref 0–37)
BILIRUBIN TOTAL: 0.7 mg/dL (ref 0.2–1.2)
BUN: 15 mg/dL (ref 6–23)
CO2: 28 mEq/L (ref 19–32)
Calcium: 9.1 mg/dL (ref 8.4–10.5)
Chloride: 104 mEq/L (ref 96–112)
Creatinine, Ser: 0.83 mg/dL (ref 0.40–1.50)
GFR: 95.41 mL/min (ref >60.00)
Glucose, Bld: 116 mg/dL — ABNORMAL HIGH (ref 70–99)
Potassium: 4.2 mEq/L (ref 3.5–5.1)
SODIUM: 139 meq/L (ref 135–145)
TOTAL PROTEIN: 7.3 g/dL (ref 6.0–8.3)

## 2018-02-25 LAB — LIPID PANEL
CHOL/HDL RATIO: 5
Cholesterol: 131 mg/dL (ref 0–200)
HDL: 28.1 mg/dL — AB (ref >39.00)
LDL Cholesterol: 68 mg/dL (ref 0–99)
NONHDL: 103.22
Triglycerides: 176 mg/dL — ABNORMAL HIGH (ref 0.0–149.0)
VLDL: 35.2 mg/dL (ref 0.0–40.0)

## 2018-02-25 LAB — CBC WITH DIFFERENTIAL/PLATELET
BASOS ABS: 0 10*3/uL (ref 0.0–0.1)
Basophils Relative: 0.7 % (ref 0.0–3.0)
EOS PCT: 3.7 % (ref 0.0–5.0)
Eosinophils Absolute: 0.3 10*3/uL (ref 0.0–0.7)
HEMATOCRIT: 36.7 % — AB (ref 39.0–52.0)
HEMOGLOBIN: 12.5 g/dL — AB (ref 13.0–17.0)
LYMPHS ABS: 2 10*3/uL (ref 0.7–4.0)
LYMPHS PCT: 28.1 % (ref 12.0–46.0)
MCHC: 34.1 g/dL (ref 30.0–36.0)
MCV: 92 fl (ref 78.0–100.0)
MONOS PCT: 9 % (ref 3.0–12.0)
Monocytes Absolute: 0.6 10*3/uL (ref 0.1–1.0)
Neutro Abs: 4.2 10*3/uL (ref 1.4–7.7)
Neutrophils Relative %: 58.5 % (ref 43.0–77.0)
Platelets: 213 10*3/uL (ref 150.0–400.0)
RBC: 3.99 Mil/uL — ABNORMAL LOW (ref 4.22–5.81)
RDW: 13.3 % (ref 11.5–15.5)
WBC: 7.1 10*3/uL (ref 4.0–10.5)

## 2018-02-25 LAB — TSH: TSH: 3.12 u[IU]/mL (ref 0.35–4.50)

## 2018-02-25 MED ORDER — CYANOCOBALAMIN 1000 MCG/ML IJ SOLN
1000.0000 ug | Freq: Once | INTRAMUSCULAR | Status: AC
  Administered 2018-02-25: 1000 ug via INTRAMUSCULAR

## 2018-02-25 NOTE — Patient Instructions (Signed)

## 2018-02-25 NOTE — Progress Notes (Signed)
Subjective:  Patient ID: Nicholas Buck, male    DOB: [birth year]  Age: 77 y.o. MRN: [id number]  CC: Hypertension; Hyperlipidemia; and Annual Exam   HPI Nicholas Buck presents for a CPX.  He tells me his blood pressure has been well controlled.  He is active and has had no recent episodes of DOE, CP, palpitations, edema, or fatigue.  He is tolerating his cholesterol medicine well with no muscle or joint aches.  His neuropathy symptoms are well controlled with gabapentin.  Past Medical History:  Diagnosis Date  . Anemia    microcytic  . Anxiety   . COPD (chronic obstructive pulmonary disease) ([place])    ?  Marland Kitchen GERD (gastroesophageal reflux disease)   . Hyperlipidemia   . Hypertension   . Neuromuscular disorder ([facility])    neuropathy  . Stroke [facility])    Past Surgical History:  Procedure Laterality Date  . COLONOSCOPY    . KNEE SURGERY     LEFT  . NECK SURGERY     DISC  . POLYPECTOMY      reports that he quit smoking about 5 years ago. His smoking use included cigarettes. He has a 50.00 pack-year smoking history. He has never used smokeless tobacco. He reports that he does not drink alcohol or use drugs. family history includes Heart disease in his father. Allergies  Allergen Reactions  . Mevacor [Lovastatin]     Muscle aches    Outpatient Medications Prior to Visit  Medication Sig Dispense Refill  . aspirin EC 81 MG tablet Take 1 tablet (81 mg total) by mouth daily. 30 tablet 11  . atorvastatin (LIPITOR) 20 MG tablet TAKE 1 TABLET BY MOUTH EVERY DAY 90 tablet 3  . metoprolol tartrate (LOPRESSOR) 25 MG tablet TAKE 1 TABLET(25 MG) BY MOUTH TWICE DAILY 60 tablet 0  . telmisartan (MICARDIS) 80 MG tablet TAKE 1 TABLET(80 MG) BY MOUTH DAILY 90 tablet 0  . tiZANidine (ZANAFLEX) 2 MG tablet TAKE 1 TABLET(2 MG) BY MOUTH EVERY 8 HOURS AS NEEDED FOR MUSCLE SPASMS 180 tablet 0  . traZODone (DESYREL) 100 MG tablet TAKE 1 TABLET(100 MG) BY MOUTH AT BEDTIME 90 tablet 0  . gabapentin  (NEURONTIN) 300 MG capsule TAKE 1 CAPSULE(300 MG) BY MOUTH THREE TIMES DAILY 90 capsule 0   Facility-Administered Medications Prior to Visit  Medication Dose Route Frequency Provider Last Rate Last Dose  . 0.9 %  sodium chloride infusion  500 mL Intravenous Continuous Irene Shipper, MD        ROS Review of Systems  Constitutional: Negative.  Negative for appetite change, diaphoresis, fatigue and unexpected weight change.  HENT: Negative.   Eyes: Negative for visual disturbance.  Respiratory: Negative for cough, chest tightness, shortness of breath and wheezing.   Cardiovascular: Negative for chest pain, palpitations and leg swelling.  Gastrointestinal: Negative for abdominal pain, constipation, diarrhea, nausea and vomiting.  Endocrine: Negative.   Genitourinary: Negative.   Musculoskeletal: Negative.  Negative for arthralgias, myalgias and neck pain.  Skin: Negative.  Negative for pallor.  Allergic/Immunologic: Negative.   Neurological: Negative.  Negative for dizziness and weakness.  Hematological: Negative for adenopathy. Does not bruise/bleed easily.  Psychiatric/Behavioral: Negative.     Objective:  BP 122/72 (BP Location: Left Arm, Patient Position: Sitting, Cuff Size: Normal)   Pulse 73   Temp 98.1 F (36.7 C) (Oral)   Resp 16   Ht 6\' 1"  (1.854 m)   Wt 189 lb (85.7 kg)   SpO2 93%  BMI 24.94 kg/m   BP Readings from Last 3 Encounters:  02/25/18 122/72  04/10/17 (!) 143/69  02/07/17 (!) 170/80    Wt Readings from Last 3 Encounters:  02/25/18 189 lb (85.7 kg)  04/10/17 201 lb (91.2 kg)  02/07/17 201 lb (91.2 kg)    Physical Exam  Constitutional: He is oriented to person, place, and time. No distress.  HENT:  Mouth/Throat: Oropharynx is clear and moist. No oropharyngeal exudate.  Eyes: Conjunctivae are normal. Right eye exhibits no discharge. Left eye exhibits no discharge. No scleral icterus.  Neck: Normal range of motion. Neck supple. No JVD present. No  thyromegaly present.  Cardiovascular: Normal rate, regular rhythm and normal heart sounds. Exam reveals no gallop.  No murmur heard. Pulmonary/Chest: Effort normal and breath sounds normal. No respiratory distress. He has no wheezes. He has no rales.  Abdominal: Soft. Bowel sounds are normal. He exhibits no distension and no mass. There is no tenderness. There is no guarding.  Genitourinary:  Genitourinary Comments: GU and rectal exams were deferred at his request  Musculoskeletal: Normal range of motion. He exhibits no edema, tenderness or deformity.  Lymphadenopathy:    He has no cervical adenopathy.  Neurological: He is alert and oriented to person, place, and time.  Skin: Skin is warm and dry. No rash noted. He is not diaphoretic. No erythema. No pallor.  Psychiatric: He has a normal mood and affect. His behavior is normal. Judgment and thought content normal.  Vitals reviewed.   Lab Results  Component Value Date   WBC 7.1 02/25/2018   HGB 12.5 (L) 02/25/2018   HCT 36.7 (L) 02/25/2018   PLT 213.0 02/25/2018   GLUCOSE 116 (H) 02/25/2018   CHOL 131 02/25/2018   TRIG 176.0 (H) 02/25/2018   HDL 28.10 (L) 02/25/2018   LDLDIRECT 103.9 07/28/2014   LDLCALC 68 02/25/2018   ALT 12 02/25/2018   AST 13 02/25/2018   NA 139 02/25/2018   K 4.2 02/25/2018   CL 104 02/25/2018   CREATININE 0.83 02/25/2018   BUN 15 02/25/2018   CO2 28 02/25/2018   TSH 3.12 02/25/2018   PSA 0.64 12/28/2016   INR 1.11 09/08/2015   HGBA1C 5.7 12/28/2016    Dg Chest 2 View  Result Date: 12/05/2016 CLINICAL DATA:  Cough, dyspnea on exertion, wheezing for 1 week. Diagnosed with pneumonia and today. History of COPD. EXAM: CHEST  2 VIEW COMPARISON:  Chest radiograph January 24, 2013 FINDINGS: Cardiomediastinal silhouette is normal, calcified aortic knob. Scattered calcified granulomata. Increased lung volumes with coarsened interstitium in the lung bases, superimposed bibasilar airspace opacities. No pleural  effusion. No pneumothorax. Soft tissue planes and included osseous structures are nonsuspicious. IMPRESSION: COPD. Superimposed bibasilar suspected pneumonia. Followup PA and lateral chest X-ray is recommended in 3-4 weeks following trial of antibiotic therapy to ensure resolution and exclude underlying malignancy. Electronically Signed   By: Elon Alas M.D.   On: 12/05/2016 16:53    Assessment & Plan:   [place] was seen today for hypertension, hyperlipidemia and annual exam.  Diagnoses and all orders for this visit:  B12 deficiency -     cyanocobalamin ((VITAMIN B-12)) injection 1,000 mcg -     CBC with Differential/Platelet; Future  Dyslipidemia, goal LDL below 70- He has achieved his LDL goal and is doing well on the statin. -     Lipid panel; Future -     TSH; Future -     Comprehensive metabolic panel; Future  Essential hypertension-  His blood pressure is well controlled.  Electrolytes and renal function are normal. -     CBC with Differential/Platelet; Future -     Comprehensive metabolic panel; Future  Routine general medical examination at a health care facility   I am having Elyn Aquas. Heyer maintain his aspirin EC, atorvastatin, telmisartan, traZODone, tiZANidine, and metoprolol tartrate. We administered cyanocobalamin. We will continue to administer sodium chloride.  Meds ordered this encounter  Medications  . cyanocobalamin ((VITAMIN B-12)) injection 1,000 mcg   See AVS for instructions about healthy living and anticipatory guidance.  Follow-up: Return in about 1 year (around 02/26/2019).  Scarlette Calico, MD

## 2018-02-26 ENCOUNTER — Encounter: Payer: Self-pay | Admitting: Internal Medicine

## 2018-02-26 ENCOUNTER — Other Ambulatory Visit: Payer: Self-pay | Admitting: Internal Medicine

## 2018-02-26 DIAGNOSIS — G63 Polyneuropathy in diseases classified elsewhere: Secondary | ICD-10-CM

## 2018-02-26 DIAGNOSIS — G609 Hereditary and idiopathic neuropathy, unspecified: Secondary | ICD-10-CM

## 2018-02-26 DIAGNOSIS — E538 Deficiency of other specified B group vitamins: Secondary | ICD-10-CM

## 2018-02-27 NOTE — Assessment & Plan Note (Signed)

## 2018-03-08 ENCOUNTER — Other Ambulatory Visit: Payer: Self-pay | Admitting: Internal Medicine

## 2018-03-08 DIAGNOSIS — I10 Essential (primary) hypertension: Secondary | ICD-10-CM

## 2018-03-08 DIAGNOSIS — I6302 Cerebral infarction due to thrombosis of basilar artery: Secondary | ICD-10-CM

## 2018-03-13 ENCOUNTER — Other Ambulatory Visit: Payer: Self-pay | Admitting: Internal Medicine

## 2018-03-16 ENCOUNTER — Other Ambulatory Visit: Payer: Self-pay | Admitting: Internal Medicine

## 2018-03-16 DIAGNOSIS — I10 Essential (primary) hypertension: Secondary | ICD-10-CM

## 2018-03-19 ENCOUNTER — Telehealth: Payer: Self-pay | Admitting: Internal Medicine

## 2018-03-19 NOTE — Telephone Encounter (Signed)
Copied from [place] [id number]. Topic: Quick Communication - See Telephone Encounter >> Mar 19, 2018  8:57 AM Ahmed Prima L wrote: CRM for notification. See Telephone encounter for: 03/19/18.  Patient states he was in the office on 02/25/18 for his physical. He said that he has been taking 325 mg of Asprin for a while now but his discharge papers say to take 81mg . He said he wants to know what he needs to do? Call back is [phone]

## 2018-03-19 NOTE — Telephone Encounter (Signed)
Patient called [phone], left VM to return call to the office to speak with NT about the aspirin dosage.

## 2018-03-25 ENCOUNTER — Other Ambulatory Visit: Payer: Self-pay | Admitting: Internal Medicine

## 2018-03-25 DIAGNOSIS — F5105 Insomnia due to other mental disorder: Principal | ICD-10-CM

## 2018-03-25 DIAGNOSIS — F409 Phobic anxiety disorder, unspecified: Secondary | ICD-10-CM

## 2018-03-25 NOTE — Telephone Encounter (Signed)
Patient  Returned  VM from  Telephone  Encounter last  Documented  Today at [phone] am.Patient advised that the record shows the patient should be on ASA EC 81  MG . No record on med list about being on ASA 325 MG

## 2018-03-25 NOTE — Telephone Encounter (Signed)
Attempted to call patient, no answer. Left voice mail for him to call back. Also left message that he has the 81 mg of ASA in the chart.

## 2018-03-25 NOTE — Telephone Encounter (Signed)
Patient calling back returning call from NT. Patient said he called back last week and no one called him back and is upset.

## 2018-04-26 ENCOUNTER — Ambulatory Visit: Payer: Medicare Other

## 2018-04-30 ENCOUNTER — Ambulatory Visit (INDEPENDENT_AMBULATORY_CARE_PROVIDER_SITE_OTHER): Payer: Medicare Other

## 2018-04-30 DIAGNOSIS — E538 Deficiency of other specified B group vitamins: Secondary | ICD-10-CM

## 2018-04-30 MED ORDER — CYANOCOBALAMIN 1000 MCG/ML IJ SOLN
1000.0000 ug | Freq: Once | INTRAMUSCULAR | Status: AC
  Administered 2018-04-30: 1000 ug via INTRAMUSCULAR

## 2018-05-06 NOTE — Progress Notes (Signed)
I have reviewed and agree.

## 2018-05-31 ENCOUNTER — Ambulatory Visit: Payer: Medicare Other

## 2018-06-14 ENCOUNTER — Other Ambulatory Visit: Payer: Self-pay | Admitting: Internal Medicine

## 2018-06-14 DIAGNOSIS — I10 Essential (primary) hypertension: Secondary | ICD-10-CM

## 2018-06-14 DIAGNOSIS — I6302 Cerebral infarction due to thrombosis of basilar artery: Secondary | ICD-10-CM

## 2018-06-21 DIAGNOSIS — R739 Hyperglycemia, unspecified: Secondary | ICD-10-CM | POA: Diagnosis not present

## 2018-06-21 DIAGNOSIS — I10 Essential (primary) hypertension: Secondary | ICD-10-CM | POA: Diagnosis not present

## 2018-06-21 DIAGNOSIS — D649 Anemia, unspecified: Secondary | ICD-10-CM | POA: Diagnosis not present

## 2018-06-21 DIAGNOSIS — R0602 Shortness of breath: Secondary | ICD-10-CM | POA: Diagnosis not present

## 2018-06-21 DIAGNOSIS — J302 Other seasonal allergic rhinitis: Secondary | ICD-10-CM | POA: Diagnosis not present

## 2018-06-21 DIAGNOSIS — H6120 Impacted cerumen, unspecified ear: Secondary | ICD-10-CM | POA: Diagnosis not present

## 2018-06-21 DIAGNOSIS — R42 Dizziness and giddiness: Secondary | ICD-10-CM | POA: Diagnosis not present

## 2018-06-26 DIAGNOSIS — R0602 Shortness of breath: Secondary | ICD-10-CM | POA: Diagnosis not present

## 2018-06-26 DIAGNOSIS — R918 Other nonspecific abnormal finding of lung field: Secondary | ICD-10-CM | POA: Diagnosis not present

## 2018-07-01 ENCOUNTER — Ambulatory Visit: Payer: Medicare Other

## 2018-07-23 DIAGNOSIS — R918 Other nonspecific abnormal finding of lung field: Secondary | ICD-10-CM | POA: Diagnosis not present

## 2018-07-23 DIAGNOSIS — J181 Lobar pneumonia, unspecified organism: Secondary | ICD-10-CM | POA: Diagnosis not present

## 2018-07-23 DIAGNOSIS — R0602 Shortness of breath: Secondary | ICD-10-CM | POA: Diagnosis not present

## 2018-07-23 DIAGNOSIS — D649 Anemia, unspecified: Secondary | ICD-10-CM | POA: Diagnosis not present

## 2018-08-01 DIAGNOSIS — J8410 Pulmonary fibrosis, unspecified: Secondary | ICD-10-CM | POA: Diagnosis not present

## 2018-08-01 DIAGNOSIS — R918 Other nonspecific abnormal finding of lung field: Secondary | ICD-10-CM | POA: Diagnosis not present

## 2018-08-01 DIAGNOSIS — I700 Atherosclerosis of aorta: Secondary | ICD-10-CM | POA: Diagnosis not present

## 2018-08-01 DIAGNOSIS — J181 Lobar pneumonia, unspecified organism: Secondary | ICD-10-CM | POA: Diagnosis not present

## 2018-08-01 DIAGNOSIS — I714 Abdominal aortic aneurysm, without rupture: Secondary | ICD-10-CM | POA: Diagnosis not present

## 2018-08-01 DIAGNOSIS — I2510 Atherosclerotic heart disease of native coronary artery without angina pectoris: Secondary | ICD-10-CM | POA: Diagnosis not present

## 2018-08-01 DIAGNOSIS — J189 Pneumonia, unspecified organism: Secondary | ICD-10-CM | POA: Diagnosis not present

## 2018-08-08 DIAGNOSIS — Z9981 Dependence on supplemental oxygen: Secondary | ICD-10-CM | POA: Diagnosis not present

## 2018-08-08 DIAGNOSIS — J449 Chronic obstructive pulmonary disease, unspecified: Secondary | ICD-10-CM | POA: Diagnosis not present

## 2018-08-08 DIAGNOSIS — R918 Other nonspecific abnormal finding of lung field: Secondary | ICD-10-CM | POA: Diagnosis not present

## 2018-08-08 DIAGNOSIS — J9611 Chronic respiratory failure with hypoxia: Secondary | ICD-10-CM | POA: Diagnosis not present

## 2018-08-09 DIAGNOSIS — C3490 Malignant neoplasm of unspecified part of unspecified bronchus or lung: Secondary | ICD-10-CM | POA: Diagnosis not present

## 2018-08-09 DIAGNOSIS — R918 Other nonspecific abnormal finding of lung field: Secondary | ICD-10-CM | POA: Diagnosis not present

## 2018-08-09 DIAGNOSIS — J9611 Chronic respiratory failure with hypoxia: Secondary | ICD-10-CM | POA: Diagnosis not present

## 2018-08-09 DIAGNOSIS — J449 Chronic obstructive pulmonary disease, unspecified: Secondary | ICD-10-CM | POA: Diagnosis not present

## 2018-08-09 DIAGNOSIS — C3432 Malignant neoplasm of lower lobe, left bronchus or lung: Secondary | ICD-10-CM | POA: Diagnosis not present

## 2018-08-12 DIAGNOSIS — R918 Other nonspecific abnormal finding of lung field: Secondary | ICD-10-CM | POA: Diagnosis not present

## 2018-08-12 DIAGNOSIS — J449 Chronic obstructive pulmonary disease, unspecified: Secondary | ICD-10-CM | POA: Diagnosis not present

## 2018-08-15 DIAGNOSIS — C3432 Malignant neoplasm of lower lobe, left bronchus or lung: Secondary | ICD-10-CM | POA: Diagnosis not present

## 2018-08-15 DIAGNOSIS — C3490 Malignant neoplasm of unspecified part of unspecified bronchus or lung: Secondary | ICD-10-CM | POA: Diagnosis not present

## 2018-08-20 DIAGNOSIS — R918 Other nonspecific abnormal finding of lung field: Secondary | ICD-10-CM | POA: Diagnosis not present

## 2018-08-20 DIAGNOSIS — J449 Chronic obstructive pulmonary disease, unspecified: Secondary | ICD-10-CM | POA: Diagnosis not present

## 2018-08-21 DIAGNOSIS — C3490 Malignant neoplasm of unspecified part of unspecified bronchus or lung: Secondary | ICD-10-CM | POA: Diagnosis not present

## 2018-08-23 DIAGNOSIS — Z8673 Personal history of transient ischemic attack (TIA), and cerebral infarction without residual deficits: Secondary | ICD-10-CM | POA: Diagnosis not present

## 2018-08-23 DIAGNOSIS — R9082 White matter disease, unspecified: Secondary | ICD-10-CM | POA: Diagnosis not present

## 2018-08-23 DIAGNOSIS — C3490 Malignant neoplasm of unspecified part of unspecified bronchus or lung: Secondary | ICD-10-CM | POA: Diagnosis not present

## 2018-08-25 ENCOUNTER — Other Ambulatory Visit: Payer: Self-pay | Admitting: Internal Medicine

## 2018-08-25 DIAGNOSIS — F5105 Insomnia due to other mental disorder: Principal | ICD-10-CM

## 2018-08-25 DIAGNOSIS — F409 Phobic anxiety disorder, unspecified: Secondary | ICD-10-CM

## 2018-08-27 DIAGNOSIS — Z87891 Personal history of nicotine dependence: Secondary | ICD-10-CM | POA: Diagnosis not present

## 2018-08-27 DIAGNOSIS — Z4682 Encounter for fitting and adjustment of non-vascular catheter: Secondary | ICD-10-CM | POA: Diagnosis not present

## 2018-08-27 DIAGNOSIS — D71 Functional disorders of polymorphonuclear neutrophils: Secondary | ICD-10-CM | POA: Diagnosis not present

## 2018-08-27 DIAGNOSIS — I10 Essential (primary) hypertension: Secondary | ICD-10-CM | POA: Diagnosis not present

## 2018-08-27 DIAGNOSIS — R918 Other nonspecific abnormal finding of lung field: Secondary | ICD-10-CM | POA: Diagnosis not present

## 2018-08-27 DIAGNOSIS — J449 Chronic obstructive pulmonary disease, unspecified: Secondary | ICD-10-CM | POA: Diagnosis not present

## 2018-08-27 DIAGNOSIS — Z9981 Dependence on supplemental oxygen: Secondary | ICD-10-CM | POA: Diagnosis not present

## 2018-08-27 DIAGNOSIS — G629 Polyneuropathy, unspecified: Secondary | ICD-10-CM | POA: Diagnosis not present

## 2018-08-27 DIAGNOSIS — G8918 Other acute postprocedural pain: Secondary | ICD-10-CM | POA: Diagnosis not present

## 2018-08-27 DIAGNOSIS — C3432 Malignant neoplasm of lower lobe, left bronchus or lung: Secondary | ICD-10-CM | POA: Diagnosis not present

## 2018-08-27 DIAGNOSIS — T797XXA Traumatic subcutaneous emphysema, initial encounter: Secondary | ICD-10-CM | POA: Diagnosis not present

## 2018-08-27 DIAGNOSIS — Z48813 Encounter for surgical aftercare following surgery on the respiratory system: Secondary | ICD-10-CM | POA: Diagnosis not present

## 2018-08-27 DIAGNOSIS — J9811 Atelectasis: Secondary | ICD-10-CM | POA: Diagnosis not present

## 2018-08-27 DIAGNOSIS — Z79899 Other long term (current) drug therapy: Secondary | ICD-10-CM | POA: Diagnosis not present

## 2018-08-27 DIAGNOSIS — E785 Hyperlipidemia, unspecified: Secondary | ICD-10-CM | POA: Diagnosis not present

## 2018-08-27 DIAGNOSIS — Z01818 Encounter for other preprocedural examination: Secondary | ICD-10-CM | POA: Diagnosis not present

## 2018-08-27 DIAGNOSIS — Z8673 Personal history of transient ischemic attack (TIA), and cerebral infarction without residual deficits: Secondary | ICD-10-CM | POA: Diagnosis not present

## 2018-08-27 DIAGNOSIS — J9611 Chronic respiratory failure with hypoxia: Secondary | ICD-10-CM | POA: Diagnosis not present

## 2018-08-27 DIAGNOSIS — R739 Hyperglycemia, unspecified: Secondary | ICD-10-CM | POA: Diagnosis not present

## 2018-08-27 DIAGNOSIS — Z6823 Body mass index (BMI) 23.0-23.9, adult: Secondary | ICD-10-CM | POA: Diagnosis not present

## 2018-08-27 DIAGNOSIS — J939 Pneumothorax, unspecified: Secondary | ICD-10-CM | POA: Diagnosis not present

## 2018-08-27 DIAGNOSIS — C3492 Malignant neoplasm of unspecified part of left bronchus or lung: Secondary | ICD-10-CM | POA: Diagnosis not present

## 2018-09-10 ENCOUNTER — Other Ambulatory Visit: Payer: Self-pay | Admitting: Internal Medicine

## 2018-09-10 DIAGNOSIS — I6302 Cerebral infarction due to thrombosis of basilar artery: Secondary | ICD-10-CM

## 2018-09-10 DIAGNOSIS — F409 Phobic anxiety disorder, unspecified: Secondary | ICD-10-CM

## 2018-09-10 DIAGNOSIS — I10 Essential (primary) hypertension: Secondary | ICD-10-CM

## 2018-09-10 DIAGNOSIS — F5105 Insomnia due to other mental disorder: Principal | ICD-10-CM

## 2018-09-11 DIAGNOSIS — R918 Other nonspecific abnormal finding of lung field: Secondary | ICD-10-CM | POA: Diagnosis not present

## 2018-09-11 DIAGNOSIS — J449 Chronic obstructive pulmonary disease, unspecified: Secondary | ICD-10-CM | POA: Diagnosis not present

## 2018-09-12 DIAGNOSIS — C3490 Malignant neoplasm of unspecified part of unspecified bronchus or lung: Secondary | ICD-10-CM | POA: Diagnosis not present

## 2018-09-17 ENCOUNTER — Other Ambulatory Visit: Payer: Self-pay | Admitting: Internal Medicine

## 2018-09-22 ENCOUNTER — Other Ambulatory Visit: Payer: Self-pay | Admitting: Internal Medicine

## 2018-09-22 DIAGNOSIS — I10 Essential (primary) hypertension: Secondary | ICD-10-CM

## 2018-09-23 DIAGNOSIS — Z9889 Other specified postprocedural states: Secondary | ICD-10-CM | POA: Diagnosis not present

## 2018-09-23 DIAGNOSIS — Z48813 Encounter for surgical aftercare following surgery on the respiratory system: Secondary | ICD-10-CM | POA: Diagnosis not present

## 2018-09-23 DIAGNOSIS — Z902 Acquired absence of lung [part of]: Secondary | ICD-10-CM | POA: Diagnosis not present

## 2018-11-25 ENCOUNTER — Other Ambulatory Visit: Payer: Self-pay | Admitting: Internal Medicine

## 2018-11-25 DIAGNOSIS — F409 Phobic anxiety disorder, unspecified: Secondary | ICD-10-CM

## 2018-11-25 DIAGNOSIS — F5105 Insomnia due to other mental disorder: Principal | ICD-10-CM

## 2018-11-28 DIAGNOSIS — H524 Presbyopia: Secondary | ICD-10-CM | POA: Diagnosis not present

## 2018-11-28 DIAGNOSIS — H52203 Unspecified astigmatism, bilateral: Secondary | ICD-10-CM | POA: Diagnosis not present

## 2018-11-29 ENCOUNTER — Other Ambulatory Visit: Payer: Self-pay | Admitting: Internal Medicine

## 2018-11-29 DIAGNOSIS — G63 Polyneuropathy in diseases classified elsewhere: Secondary | ICD-10-CM

## 2018-11-29 DIAGNOSIS — E538 Deficiency of other specified B group vitamins: Secondary | ICD-10-CM

## 2018-11-29 DIAGNOSIS — G609 Hereditary and idiopathic neuropathy, unspecified: Secondary | ICD-10-CM

## 2018-12-13 DIAGNOSIS — E1142 Type 2 diabetes mellitus with diabetic polyneuropathy: Secondary | ICD-10-CM | POA: Diagnosis not present

## 2018-12-13 DIAGNOSIS — C3432 Malignant neoplasm of lower lobe, left bronchus or lung: Secondary | ICD-10-CM | POA: Diagnosis not present

## 2018-12-13 DIAGNOSIS — C3490 Malignant neoplasm of unspecified part of unspecified bronchus or lung: Secondary | ICD-10-CM | POA: Diagnosis not present

## 2018-12-13 DIAGNOSIS — J449 Chronic obstructive pulmonary disease, unspecified: Secondary | ICD-10-CM | POA: Diagnosis not present

## 2018-12-17 ENCOUNTER — Other Ambulatory Visit: Payer: Self-pay | Admitting: Internal Medicine

## 2018-12-19 ENCOUNTER — Other Ambulatory Visit: Payer: Self-pay | Admitting: Internal Medicine

## 2018-12-19 DIAGNOSIS — I10 Essential (primary) hypertension: Secondary | ICD-10-CM

## 2019-01-06 DIAGNOSIS — H43813 Vitreous degeneration, bilateral: Secondary | ICD-10-CM | POA: Diagnosis not present

## 2019-01-06 DIAGNOSIS — I10 Essential (primary) hypertension: Secondary | ICD-10-CM | POA: Diagnosis not present

## 2019-01-06 DIAGNOSIS — H34833 Tributary (branch) retinal vein occlusion, bilateral, with macular edema: Secondary | ICD-10-CM | POA: Diagnosis not present

## 2019-01-06 DIAGNOSIS — H35372 Puckering of macula, left eye: Secondary | ICD-10-CM | POA: Diagnosis not present

## 2019-01-06 DIAGNOSIS — Z961 Presence of intraocular lens: Secondary | ICD-10-CM | POA: Diagnosis not present

## 2019-01-29 DIAGNOSIS — H34833 Tributary (branch) retinal vein occlusion, bilateral, with macular edema: Secondary | ICD-10-CM | POA: Diagnosis not present

## 2019-01-29 DIAGNOSIS — I10 Essential (primary) hypertension: Secondary | ICD-10-CM | POA: Diagnosis not present

## 2019-01-29 DIAGNOSIS — Z961 Presence of intraocular lens: Secondary | ICD-10-CM | POA: Diagnosis not present

## 2019-01-29 DIAGNOSIS — H35372 Puckering of macula, left eye: Secondary | ICD-10-CM | POA: Diagnosis not present

## 2019-01-29 DIAGNOSIS — H43813 Vitreous degeneration, bilateral: Secondary | ICD-10-CM | POA: Diagnosis not present

## 2019-01-29 DIAGNOSIS — Z87891 Personal history of nicotine dependence: Secondary | ICD-10-CM | POA: Diagnosis not present

## 2019-01-29 DIAGNOSIS — E785 Hyperlipidemia, unspecified: Secondary | ICD-10-CM | POA: Diagnosis not present

## 2019-02-04 ENCOUNTER — Telehealth: Payer: Self-pay

## 2019-02-04 NOTE — Telephone Encounter (Signed)
Recvd an rx refill request for Trazadone.   Pt will need to schedule an appt. LOV was 02/2018.   Can you call at your earliest convenience?

## 2019-02-05 NOTE — Telephone Encounter (Signed)
LVM for patient to call back to make an appointment, he is due for a CPE either on or after 4.2.2020. Please make appt and once appt is made we can send in refill.

## 2019-02-14 ENCOUNTER — Other Ambulatory Visit: Payer: Self-pay | Admitting: Internal Medicine

## 2019-02-14 DIAGNOSIS — F409 Phobic anxiety disorder, unspecified: Secondary | ICD-10-CM

## 2019-02-14 DIAGNOSIS — F5105 Insomnia due to other mental disorder: Principal | ICD-10-CM

## 2019-02-14 MED ORDER — TRAZODONE HCL 100 MG PO TABS: ORAL_TABLET | ORAL | 0 refills | Status: DC

## 2019-02-24 ENCOUNTER — Other Ambulatory Visit: Payer: Self-pay | Admitting: Internal Medicine

## 2019-02-24 DIAGNOSIS — F409 Phobic anxiety disorder, unspecified: Secondary | ICD-10-CM

## 2019-02-24 DIAGNOSIS — F5105 Insomnia due to other mental disorder: Principal | ICD-10-CM

## 2019-02-24 MED ORDER — TRAZODONE HCL 100 MG PO TABS: ORAL_TABLET | ORAL | 0 refills | Status: AC

## 2019-03-10 DIAGNOSIS — C3432 Malignant neoplasm of lower lobe, left bronchus or lung: Secondary | ICD-10-CM | POA: Diagnosis not present

## 2019-03-10 DIAGNOSIS — C3490 Malignant neoplasm of unspecified part of unspecified bronchus or lung: Secondary | ICD-10-CM | POA: Diagnosis not present

## 2019-03-13 DIAGNOSIS — C3491 Malignant neoplasm of unspecified part of right bronchus or lung: Secondary | ICD-10-CM | POA: Diagnosis not present

## 2019-03-16 ENCOUNTER — Other Ambulatory Visit: Payer: Self-pay | Admitting: Internal Medicine

## 2019-03-16 DIAGNOSIS — I10 Essential (primary) hypertension: Secondary | ICD-10-CM

## 2019-03-16 DIAGNOSIS — I6302 Cerebral infarction due to thrombosis of basilar artery: Secondary | ICD-10-CM

## 2019-03-17 ENCOUNTER — Other Ambulatory Visit: Payer: Self-pay

## 2019-03-17 NOTE — Telephone Encounter (Signed)
Last visit was 02/2018.

## 2019-03-20 ENCOUNTER — Ambulatory Visit (INDEPENDENT_AMBULATORY_CARE_PROVIDER_SITE_OTHER): Payer: Medicare Other | Admitting: Internal Medicine

## 2019-03-20 ENCOUNTER — Other Ambulatory Visit: Payer: Self-pay | Admitting: Internal Medicine

## 2019-03-20 ENCOUNTER — Encounter: Payer: Self-pay | Admitting: Internal Medicine

## 2019-03-20 ENCOUNTER — Other Ambulatory Visit: Payer: Self-pay

## 2019-03-20 DIAGNOSIS — I10 Essential (primary) hypertension: Secondary | ICD-10-CM | POA: Diagnosis not present

## 2019-03-20 DIAGNOSIS — I6302 Cerebral infarction due to thrombosis of basilar artery: Secondary | ICD-10-CM

## 2019-03-20 MED ORDER — ATORVASTATIN CALCIUM 20 MG PO TABS: 20.0000 mg | ORAL_TABLET | Freq: Every day | ORAL | 0 refills | Status: DC

## 2019-03-20 MED ORDER — METOPROLOL TARTRATE 25 MG PO TABS: ORAL_TABLET | ORAL | 0 refills | Status: DC

## 2019-03-20 MED ORDER — TELMISARTAN 80 MG PO TABS: ORAL_TABLET | ORAL | 0 refills | Status: DC

## 2019-03-20 NOTE — Progress Notes (Signed)
Virtual Visit via Video Note  I connected with Nicholas Buck on 03/20/19 at  1:30 PM EDT by a video enabled telemedicine application and verified that I am speaking with the correct person using two identifiers.   I discussed the limitations of evaluation and management by telemedicine and the availability of in person appointments. The patient expressed understanding and agreed to proceed.  History of Present Illness: He checked in for virtual visit.  He was not willing to come due to the COVID-19 pandemic.  He tells me that since I last saw him he has been treated for lung cancer.  He underwent surgery at [facility] in [place] and tells me the surgery was a cure.  He had a 90-day report recently and was told that he has recovered nicely.  He needs a refill on his antihypertensives and statin.  Hehas moved to [place], [place] and does not have a local primary care physician.  He does not monitor his blood pressure but based on his symptoms he thinks his blood pressure has been well controlled.  He denied headache, blurred vision, chest pain, shortness of breath, dyspnea on exertion, muscle aches, or fatigue.  He also needs a refill on his statin.    Observations/Objective: He was in no acute distress.  He was calm, cooperative, and appropriate.  He did not have any means to check his blood pressure.   Lab Results  Component Value Date   WBC 7.1 02/25/2018   HGB 12.5 (L) 02/25/2018   HCT 36.7 (L) 02/25/2018   PLT 213.0 02/25/2018   GLUCOSE 116 (H) 02/25/2018   CHOL 131 02/25/2018   TRIG 176.0 (H) 02/25/2018   HDL 28.10 (L) 02/25/2018   LDLDIRECT 103.9 07/28/2014   LDLCALC 68 02/25/2018   ALT 12 02/25/2018   AST 13 02/25/2018   NA 139 02/25/2018   K 4.2 02/25/2018   CL 104 02/25/2018   CREATININE 0.83 02/25/2018   BUN 15 02/25/2018   CO2 28 02/25/2018   TSH 3.12 02/25/2018   PSA 0.64 12/28/2016   INR 1.11 09/08/2015   HGBA1C 5.7 12/28/2016     Assessment and Plan: He is tolerating  his antihypertensives and statin well.  I have asked him to start monitoring his blood pressure.  For now, I will continue the ARB, beta-blocker, and statin at their current doses.  He agrees to see a local primary care physician within the next few months.   Follow Up Instructions: He will let me know if he develops any new or worsening symptoms.  He agrees to be compliant with the above listed medications.  He agrees to see a local PCP soon.    I discussed the assessment and treatment plan with the patient. The patient was provided an opportunity to ask questions and all were answered. The patient agreed with the plan and demonstrated an understanding of the instructions.   The patient was advised to call back or seek an in-person evaluation if the symptoms worsen or if the condition fails to improve as anticipated.  I provided 15 minutes of non-face-to-face time during this encounter.   Scarlette Calico, MD

## 2019-05-13 DIAGNOSIS — R0600 Dyspnea, unspecified: Secondary | ICD-10-CM | POA: Diagnosis not present

## 2019-05-13 DIAGNOSIS — Z902 Acquired absence of lung [part of]: Secondary | ICD-10-CM | POA: Diagnosis not present

## 2019-05-13 DIAGNOSIS — R918 Other nonspecific abnormal finding of lung field: Secondary | ICD-10-CM | POA: Diagnosis not present

## 2019-05-13 DIAGNOSIS — R29898 Other symptoms and signs involving the musculoskeletal system: Secondary | ICD-10-CM | POA: Diagnosis not present

## 2019-05-21 DIAGNOSIS — R0600 Dyspnea, unspecified: Secondary | ICD-10-CM | POA: Diagnosis not present

## 2019-05-21 DIAGNOSIS — J9611 Chronic respiratory failure with hypoxia: Secondary | ICD-10-CM | POA: Diagnosis not present

## 2019-05-21 DIAGNOSIS — J449 Chronic obstructive pulmonary disease, unspecified: Secondary | ICD-10-CM | POA: Diagnosis not present

## 2019-05-21 DIAGNOSIS — J849 Interstitial pulmonary disease, unspecified: Secondary | ICD-10-CM | POA: Diagnosis not present

## 2019-05-23 DIAGNOSIS — J449 Chronic obstructive pulmonary disease, unspecified: Secondary | ICD-10-CM | POA: Diagnosis not present

## 2019-05-26 ENCOUNTER — Other Ambulatory Visit: Payer: Self-pay | Admitting: Internal Medicine

## 2019-05-26 DIAGNOSIS — E538 Deficiency of other specified B group vitamins: Secondary | ICD-10-CM

## 2019-05-26 DIAGNOSIS — G63 Polyneuropathy in diseases classified elsewhere: Secondary | ICD-10-CM

## 2019-05-26 DIAGNOSIS — G609 Hereditary and idiopathic neuropathy, unspecified: Secondary | ICD-10-CM

## 2019-05-26 MED ORDER — GABAPENTIN 300 MG PO CAPS: ORAL_CAPSULE | ORAL | 3 refills | Status: DC

## 2019-05-29 DIAGNOSIS — J439 Emphysema, unspecified: Secondary | ICD-10-CM | POA: Diagnosis not present

## 2019-05-29 DIAGNOSIS — C3490 Malignant neoplasm of unspecified part of unspecified bronchus or lung: Secondary | ICD-10-CM | POA: Diagnosis not present

## 2019-05-29 DIAGNOSIS — J8410 Pulmonary fibrosis, unspecified: Secondary | ICD-10-CM | POA: Diagnosis not present

## 2019-05-29 DIAGNOSIS — J849 Interstitial pulmonary disease, unspecified: Secondary | ICD-10-CM | POA: Diagnosis not present

## 2019-06-04 DIAGNOSIS — J449 Chronic obstructive pulmonary disease, unspecified: Secondary | ICD-10-CM | POA: Diagnosis not present

## 2019-06-05 DIAGNOSIS — E119 Type 2 diabetes mellitus without complications: Secondary | ICD-10-CM | POA: Diagnosis not present

## 2019-06-05 DIAGNOSIS — J449 Chronic obstructive pulmonary disease, unspecified: Secondary | ICD-10-CM | POA: Diagnosis not present

## 2019-06-05 DIAGNOSIS — Z85118 Personal history of other malignant neoplasm of bronchus and lung: Secondary | ICD-10-CM | POA: Diagnosis not present

## 2019-06-05 DIAGNOSIS — G629 Polyneuropathy, unspecified: Secondary | ICD-10-CM | POA: Diagnosis not present

## 2019-06-05 DIAGNOSIS — C3491 Malignant neoplasm of unspecified part of right bronchus or lung: Secondary | ICD-10-CM | POA: Diagnosis not present

## 2019-06-05 DIAGNOSIS — Z08 Encounter for follow-up examination after completed treatment for malignant neoplasm: Secondary | ICD-10-CM | POA: Diagnosis not present

## 2019-06-11 ENCOUNTER — Other Ambulatory Visit: Payer: Self-pay | Admitting: Internal Medicine

## 2019-06-11 DIAGNOSIS — I6302 Cerebral infarction due to thrombosis of basilar artery: Secondary | ICD-10-CM

## 2019-06-11 DIAGNOSIS — I10 Essential (primary) hypertension: Secondary | ICD-10-CM

## 2019-06-12 DIAGNOSIS — R0600 Dyspnea, unspecified: Secondary | ICD-10-CM | POA: Diagnosis not present

## 2019-06-12 DIAGNOSIS — C3432 Malignant neoplasm of lower lobe, left bronchus or lung: Secondary | ICD-10-CM | POA: Diagnosis not present

## 2019-06-12 DIAGNOSIS — J849 Interstitial pulmonary disease, unspecified: Secondary | ICD-10-CM | POA: Diagnosis not present

## 2019-06-12 DIAGNOSIS — J449 Chronic obstructive pulmonary disease, unspecified: Secondary | ICD-10-CM | POA: Diagnosis not present

## 2019-06-21 ENCOUNTER — Other Ambulatory Visit: Payer: Self-pay | Admitting: Internal Medicine

## 2019-06-21 DIAGNOSIS — I10 Essential (primary) hypertension: Secondary | ICD-10-CM

## 2019-06-21 DIAGNOSIS — E785 Hyperlipidemia, unspecified: Secondary | ICD-10-CM

## 2019-06-21 DIAGNOSIS — I6302 Cerebral infarction due to thrombosis of basilar artery: Secondary | ICD-10-CM

## 2019-06-21 MED ORDER — METOPROLOL TARTRATE 25 MG PO TABS: ORAL_TABLET | ORAL | 0 refills | Status: DC

## 2019-06-21 MED ORDER — ATORVASTATIN CALCIUM 20 MG PO TABS: 20.0000 mg | ORAL_TABLET | Freq: Every day | ORAL | 0 refills | Status: DC

## 2019-06-22 DIAGNOSIS — J449 Chronic obstructive pulmonary disease, unspecified: Secondary | ICD-10-CM | POA: Diagnosis not present

## 2019-06-24 ENCOUNTER — Other Ambulatory Visit: Payer: Self-pay | Admitting: Internal Medicine

## 2019-07-23 DIAGNOSIS — J449 Chronic obstructive pulmonary disease, unspecified: Secondary | ICD-10-CM | POA: Diagnosis not present

## 2019-08-23 DIAGNOSIS — J449 Chronic obstructive pulmonary disease, unspecified: Secondary | ICD-10-CM | POA: Diagnosis not present

## 2019-09-10 ENCOUNTER — Other Ambulatory Visit: Payer: Self-pay | Admitting: Internal Medicine

## 2019-09-10 DIAGNOSIS — I6302 Cerebral infarction due to thrombosis of basilar artery: Secondary | ICD-10-CM

## 2019-09-10 DIAGNOSIS — I10 Essential (primary) hypertension: Secondary | ICD-10-CM

## 2019-09-10 MED ORDER — TELMISARTAN 80 MG PO TABS: 80.0000 mg | ORAL_TABLET | Freq: Every day | ORAL | 0 refills | Status: AC

## 2019-09-22 DIAGNOSIS — J449 Chronic obstructive pulmonary disease, unspecified: Secondary | ICD-10-CM | POA: Diagnosis not present

## 2019-09-25 ENCOUNTER — Other Ambulatory Visit: Payer: Self-pay | Admitting: Internal Medicine

## 2019-09-25 DIAGNOSIS — I10 Essential (primary) hypertension: Secondary | ICD-10-CM

## 2019-09-25 DIAGNOSIS — E785 Hyperlipidemia, unspecified: Secondary | ICD-10-CM

## 2019-09-25 DIAGNOSIS — I6302 Cerebral infarction due to thrombosis of basilar artery: Secondary | ICD-10-CM

## 2019-09-25 MED ORDER — METOPROLOL TARTRATE 25 MG PO TABS: ORAL_TABLET | ORAL | 0 refills | Status: AC

## 2019-09-25 MED ORDER — ATORVASTATIN CALCIUM 20 MG PO TABS: 20.0000 mg | ORAL_TABLET | Freq: Every day | ORAL | 0 refills | Status: AC

## 2019-10-23 DIAGNOSIS — J449 Chronic obstructive pulmonary disease, unspecified: Secondary | ICD-10-CM | POA: Diagnosis not present

## 2019-11-11 ENCOUNTER — Other Ambulatory Visit: Payer: Self-pay | Admitting: Internal Medicine

## 2019-11-11 DIAGNOSIS — E538 Deficiency of other specified B group vitamins: Secondary | ICD-10-CM

## 2019-11-11 DIAGNOSIS — G609 Hereditary and idiopathic neuropathy, unspecified: Secondary | ICD-10-CM

## 2019-11-11 MED ORDER — GABAPENTIN 300 MG PO CAPS: ORAL_CAPSULE | ORAL | 3 refills | Status: AC

## 2019-11-22 DIAGNOSIS — J449 Chronic obstructive pulmonary disease, unspecified: Secondary | ICD-10-CM | POA: Diagnosis not present

## 2019-12-15 ENCOUNTER — Telehealth: Payer: Self-pay

## 2019-12-15 NOTE — Telephone Encounter (Signed)
Refill request from [facility] in [place], [place] for telmisartan 80mg  tablets. Per PCP, pt will need to schedule an appointment. Once sched, I can request a refill.

## 2019-12-19 DIAGNOSIS — J431 Panlobular emphysema: Secondary | ICD-10-CM | POA: Diagnosis not present

## 2019-12-19 DIAGNOSIS — I10 Essential (primary) hypertension: Secondary | ICD-10-CM | POA: Diagnosis not present

## 2019-12-19 DIAGNOSIS — I6302 Cerebral infarction due to thrombosis of basilar artery: Secondary | ICD-10-CM | POA: Diagnosis not present

## 2019-12-23 DIAGNOSIS — J449 Chronic obstructive pulmonary disease, unspecified: Secondary | ICD-10-CM | POA: Diagnosis not present

## 2020-01-23 DIAGNOSIS — J449 Chronic obstructive pulmonary disease, unspecified: Secondary | ICD-10-CM | POA: Diagnosis not present

## 2020-02-06 DIAGNOSIS — B029 Zoster without complications: Secondary | ICD-10-CM | POA: Diagnosis not present

## 2020-02-06 DIAGNOSIS — I10 Essential (primary) hypertension: Secondary | ICD-10-CM | POA: Diagnosis not present

## 2020-02-20 DIAGNOSIS — J449 Chronic obstructive pulmonary disease, unspecified: Secondary | ICD-10-CM | POA: Diagnosis not present

## 2020-03-22 DIAGNOSIS — J449 Chronic obstructive pulmonary disease, unspecified: Secondary | ICD-10-CM | POA: Diagnosis not present

## 2020-04-21 DIAGNOSIS — J449 Chronic obstructive pulmonary disease, unspecified: Secondary | ICD-10-CM | POA: Diagnosis not present

## 2020-05-22 DIAGNOSIS — J449 Chronic obstructive pulmonary disease, unspecified: Secondary | ICD-10-CM | POA: Diagnosis not present

## 2020-06-16 DIAGNOSIS — Z961 Presence of intraocular lens: Secondary | ICD-10-CM | POA: Diagnosis not present

## 2020-06-16 DIAGNOSIS — H04123 Dry eye syndrome of bilateral lacrimal glands: Secondary | ICD-10-CM | POA: Diagnosis not present

## 2020-06-16 DIAGNOSIS — H34833 Tributary (branch) retinal vein occlusion, bilateral, with macular edema: Secondary | ICD-10-CM | POA: Diagnosis not present

## 2020-06-16 DIAGNOSIS — H02839 Dermatochalasis of unspecified eye, unspecified eyelid: Secondary | ICD-10-CM | POA: Diagnosis not present

## 2020-06-18 DIAGNOSIS — I10 Essential (primary) hypertension: Secondary | ICD-10-CM | POA: Diagnosis not present

## 2020-06-18 DIAGNOSIS — Z0000 Encounter for general adult medical examination without abnormal findings: Secondary | ICD-10-CM | POA: Diagnosis not present

## 2020-06-18 DIAGNOSIS — I714 Abdominal aortic aneurysm, without rupture: Secondary | ICD-10-CM | POA: Diagnosis not present

## 2020-06-18 DIAGNOSIS — B0229 Other postherpetic nervous system involvement: Secondary | ICD-10-CM | POA: Diagnosis not present

## 2020-06-21 DIAGNOSIS — J449 Chronic obstructive pulmonary disease, unspecified: Secondary | ICD-10-CM | POA: Diagnosis not present

## 2020-06-22 DIAGNOSIS — I714 Abdominal aortic aneurysm, without rupture: Secondary | ICD-10-CM | POA: Diagnosis not present

## 2020-06-24 DIAGNOSIS — H43813 Vitreous degeneration, bilateral: Secondary | ICD-10-CM | POA: Diagnosis not present

## 2020-06-24 DIAGNOSIS — H35372 Puckering of macula, left eye: Secondary | ICD-10-CM | POA: Diagnosis not present

## 2020-06-24 DIAGNOSIS — H34832 Tributary (branch) retinal vein occlusion, left eye, with macular edema: Secondary | ICD-10-CM | POA: Diagnosis not present

## 2020-06-24 DIAGNOSIS — Z961 Presence of intraocular lens: Secondary | ICD-10-CM | POA: Diagnosis not present

## 2020-06-28 DIAGNOSIS — H34832 Tributary (branch) retinal vein occlusion, left eye, with macular edema: Secondary | ICD-10-CM | POA: Diagnosis not present

## 2020-07-22 DIAGNOSIS — J449 Chronic obstructive pulmonary disease, unspecified: Secondary | ICD-10-CM | POA: Diagnosis not present

## 2020-07-26 DIAGNOSIS — H35372 Puckering of macula, left eye: Secondary | ICD-10-CM | POA: Diagnosis not present

## 2020-07-26 DIAGNOSIS — H43813 Vitreous degeneration, bilateral: Secondary | ICD-10-CM | POA: Diagnosis not present

## 2020-07-26 DIAGNOSIS — H34832 Tributary (branch) retinal vein occlusion, left eye, with macular edema: Secondary | ICD-10-CM | POA: Diagnosis not present

## 2020-08-22 DIAGNOSIS — J449 Chronic obstructive pulmonary disease, unspecified: Secondary | ICD-10-CM | POA: Diagnosis not present

## 2020-08-30 DIAGNOSIS — H34832 Tributary (branch) retinal vein occlusion, left eye, with macular edema: Secondary | ICD-10-CM | POA: Diagnosis not present

## 2020-08-30 DIAGNOSIS — H43813 Vitreous degeneration, bilateral: Secondary | ICD-10-CM | POA: Diagnosis not present

## 2020-08-30 DIAGNOSIS — H35372 Puckering of macula, left eye: Secondary | ICD-10-CM | POA: Diagnosis not present

## 2020-09-09 ENCOUNTER — Other Ambulatory Visit: Payer: Self-pay | Admitting: Internal Medicine

## 2020-09-09 DIAGNOSIS — F409 Phobic anxiety disorder, unspecified: Secondary | ICD-10-CM

## 2020-09-21 DIAGNOSIS — J449 Chronic obstructive pulmonary disease, unspecified: Secondary | ICD-10-CM | POA: Diagnosis not present

## 2020-09-27 DIAGNOSIS — H43813 Vitreous degeneration, bilateral: Secondary | ICD-10-CM | POA: Diagnosis not present

## 2020-09-27 DIAGNOSIS — H34832 Tributary (branch) retinal vein occlusion, left eye, with macular edema: Secondary | ICD-10-CM | POA: Diagnosis not present

## 2020-09-27 DIAGNOSIS — H35372 Puckering of macula, left eye: Secondary | ICD-10-CM | POA: Diagnosis not present

## 2020-10-22 DIAGNOSIS — J449 Chronic obstructive pulmonary disease, unspecified: Secondary | ICD-10-CM | POA: Diagnosis not present

## 2020-10-25 DIAGNOSIS — H35372 Puckering of macula, left eye: Secondary | ICD-10-CM | POA: Diagnosis not present

## 2020-10-25 DIAGNOSIS — H34832 Tributary (branch) retinal vein occlusion, left eye, with macular edema: Secondary | ICD-10-CM | POA: Diagnosis not present

## 2020-10-25 DIAGNOSIS — H43813 Vitreous degeneration, bilateral: Secondary | ICD-10-CM | POA: Diagnosis not present

## 2020-11-21 DIAGNOSIS — J449 Chronic obstructive pulmonary disease, unspecified: Secondary | ICD-10-CM | POA: Diagnosis not present

## 2020-12-06 DIAGNOSIS — H35372 Puckering of macula, left eye: Secondary | ICD-10-CM | POA: Diagnosis not present

## 2020-12-06 DIAGNOSIS — H43813 Vitreous degeneration, bilateral: Secondary | ICD-10-CM | POA: Diagnosis not present

## 2020-12-06 DIAGNOSIS — H34832 Tributary (branch) retinal vein occlusion, left eye, with macular edema: Secondary | ICD-10-CM | POA: Diagnosis not present

## 2020-12-22 DIAGNOSIS — J449 Chronic obstructive pulmonary disease, unspecified: Secondary | ICD-10-CM | POA: Diagnosis not present

## 2020-12-28 DIAGNOSIS — H6122 Impacted cerumen, left ear: Secondary | ICD-10-CM | POA: Diagnosis not present

## 2021-01-03 DIAGNOSIS — H35372 Puckering of macula, left eye: Secondary | ICD-10-CM | POA: Diagnosis not present

## 2021-01-03 DIAGNOSIS — H34832 Tributary (branch) retinal vein occlusion, left eye, with macular edema: Secondary | ICD-10-CM | POA: Diagnosis not present

## 2021-01-03 DIAGNOSIS — H43813 Vitreous degeneration, bilateral: Secondary | ICD-10-CM | POA: Diagnosis not present

## 2021-01-22 DIAGNOSIS — J449 Chronic obstructive pulmonary disease, unspecified: Secondary | ICD-10-CM | POA: Diagnosis not present

## 2021-02-19 DIAGNOSIS — J449 Chronic obstructive pulmonary disease, unspecified: Secondary | ICD-10-CM | POA: Diagnosis not present

## 2021-03-22 DIAGNOSIS — J449 Chronic obstructive pulmonary disease, unspecified: Secondary | ICD-10-CM | POA: Diagnosis not present

## 2021-04-21 DIAGNOSIS — J449 Chronic obstructive pulmonary disease, unspecified: Secondary | ICD-10-CM | POA: Diagnosis not present

## 2021-05-22 DIAGNOSIS — J449 Chronic obstructive pulmonary disease, unspecified: Secondary | ICD-10-CM | POA: Diagnosis not present

## 2021-07-18 DIAGNOSIS — G4700 Insomnia, unspecified: Secondary | ICD-10-CM | POA: Diagnosis not present

## 2021-07-18 DIAGNOSIS — Z902 Acquired absence of lung [part of]: Secondary | ICD-10-CM | POA: Diagnosis not present

## 2021-07-18 DIAGNOSIS — E785 Hyperlipidemia, unspecified: Secondary | ICD-10-CM | POA: Diagnosis not present

## 2021-07-18 DIAGNOSIS — I10 Essential (primary) hypertension: Secondary | ICD-10-CM | POA: Diagnosis not present

## 2021-07-18 DIAGNOSIS — G629 Polyneuropathy, unspecified: Secondary | ICD-10-CM | POA: Diagnosis not present

## 2021-12-06 DIAGNOSIS — Z87891 Personal history of nicotine dependence: Secondary | ICD-10-CM | POA: Diagnosis not present

## 2021-12-06 DIAGNOSIS — Z20822 Contact with and (suspected) exposure to covid-19: Secondary | ICD-10-CM | POA: Diagnosis not present

## 2021-12-06 DIAGNOSIS — R404 Transient alteration of awareness: Secondary | ICD-10-CM | POA: Diagnosis not present

## 2021-12-06 DIAGNOSIS — E872 Acidosis, unspecified: Secondary | ICD-10-CM | POA: Diagnosis not present

## 2021-12-06 DIAGNOSIS — R918 Other nonspecific abnormal finding of lung field: Secondary | ICD-10-CM | POA: Diagnosis not present

## 2021-12-06 DIAGNOSIS — J90 Pleural effusion, not elsewhere classified: Secondary | ICD-10-CM | POA: Diagnosis not present

## 2021-12-06 DIAGNOSIS — A409 Streptococcal sepsis, unspecified: Secondary | ICD-10-CM | POA: Diagnosis not present

## 2021-12-06 DIAGNOSIS — Z8673 Personal history of transient ischemic attack (TIA), and cerebral infarction without residual deficits: Secondary | ICD-10-CM | POA: Diagnosis not present

## 2021-12-06 DIAGNOSIS — E878 Other disorders of electrolyte and fluid balance, not elsewhere classified: Secondary | ICD-10-CM | POA: Diagnosis not present

## 2021-12-06 DIAGNOSIS — R6889 Other general symptoms and signs: Secondary | ICD-10-CM | POA: Diagnosis not present

## 2021-12-06 DIAGNOSIS — Z452 Encounter for adjustment and management of vascular access device: Secondary | ICD-10-CM | POA: Diagnosis not present

## 2021-12-06 DIAGNOSIS — R410 Disorientation, unspecified: Secondary | ICD-10-CM | POA: Diagnosis not present

## 2021-12-06 DIAGNOSIS — R059 Cough, unspecified: Secondary | ICD-10-CM | POA: Diagnosis not present

## 2021-12-06 DIAGNOSIS — Z7901 Long term (current) use of anticoagulants: Secondary | ICD-10-CM | POA: Diagnosis not present

## 2021-12-06 DIAGNOSIS — K7200 Acute and subacute hepatic failure without coma: Secondary | ICD-10-CM | POA: Diagnosis not present

## 2021-12-06 DIAGNOSIS — I959 Hypotension, unspecified: Secondary | ICD-10-CM | POA: Diagnosis not present

## 2021-12-06 DIAGNOSIS — J849 Interstitial pulmonary disease, unspecified: Secondary | ICD-10-CM | POA: Diagnosis not present

## 2021-12-06 DIAGNOSIS — J9601 Acute respiratory failure with hypoxia: Secondary | ICD-10-CM | POA: Diagnosis not present

## 2021-12-06 DIAGNOSIS — I080 Rheumatic disorders of both mitral and aortic valves: Secondary | ICD-10-CM | POA: Diagnosis not present

## 2021-12-06 DIAGNOSIS — Z743 Need for continuous supervision: Secondary | ICD-10-CM | POA: Diagnosis not present

## 2021-12-06 DIAGNOSIS — R0602 Shortness of breath: Secondary | ICD-10-CM | POA: Diagnosis not present

## 2021-12-06 DIAGNOSIS — A419 Sepsis, unspecified organism: Secondary | ICD-10-CM | POA: Diagnosis not present

## 2021-12-06 DIAGNOSIS — I499 Cardiac arrhythmia, unspecified: Secondary | ICD-10-CM | POA: Diagnosis not present

## 2021-12-06 DIAGNOSIS — Z85118 Personal history of other malignant neoplasm of bronchus and lung: Secondary | ICD-10-CM | POA: Diagnosis not present

## 2021-12-06 DIAGNOSIS — J9611 Chronic respiratory failure with hypoxia: Secondary | ICD-10-CM | POA: Diagnosis not present

## 2021-12-06 DIAGNOSIS — E871 Hypo-osmolality and hyponatremia: Secondary | ICD-10-CM | POA: Diagnosis not present

## 2021-12-06 DIAGNOSIS — R6520 Severe sepsis without septic shock: Secondary | ICD-10-CM | POA: Diagnosis not present

## 2021-12-06 DIAGNOSIS — E876 Hypokalemia: Secondary | ICD-10-CM | POA: Diagnosis not present

## 2021-12-06 DIAGNOSIS — J431 Panlobular emphysema: Secondary | ICD-10-CM | POA: Diagnosis not present

## 2021-12-06 DIAGNOSIS — I10 Essential (primary) hypertension: Secondary | ICD-10-CM | POA: Diagnosis not present

## 2021-12-06 DIAGNOSIS — N179 Acute kidney failure, unspecified: Secondary | ICD-10-CM | POA: Diagnosis not present

## 2021-12-06 DIAGNOSIS — J441 Chronic obstructive pulmonary disease with (acute) exacerbation: Secondary | ICD-10-CM | POA: Diagnosis not present

## 2021-12-06 DIAGNOSIS — Z902 Acquired absence of lung [part of]: Secondary | ICD-10-CM | POA: Diagnosis not present

## 2021-12-06 DIAGNOSIS — R7989 Other specified abnormal findings of blood chemistry: Secondary | ICD-10-CM | POA: Diagnosis not present

## 2021-12-06 DIAGNOSIS — A491 Streptococcal infection, unspecified site: Secondary | ICD-10-CM | POA: Diagnosis not present

## 2021-12-06 DIAGNOSIS — I77810 Thoracic aortic ectasia: Secondary | ICD-10-CM | POA: Diagnosis not present

## 2021-12-06 DIAGNOSIS — I4891 Unspecified atrial fibrillation: Secondary | ICD-10-CM | POA: Diagnosis not present

## 2021-12-07 DIAGNOSIS — I080 Rheumatic disorders of both mitral and aortic valves: Secondary | ICD-10-CM | POA: Diagnosis not present

## 2021-12-07 DIAGNOSIS — J9601 Acute respiratory failure with hypoxia: Secondary | ICD-10-CM | POA: Diagnosis not present

## 2021-12-07 DIAGNOSIS — I4891 Unspecified atrial fibrillation: Secondary | ICD-10-CM | POA: Diagnosis not present

## 2021-12-07 DIAGNOSIS — E872 Acidosis, unspecified: Secondary | ICD-10-CM | POA: Diagnosis not present

## 2021-12-07 DIAGNOSIS — Z902 Acquired absence of lung [part of]: Secondary | ICD-10-CM | POA: Diagnosis not present

## 2021-12-07 DIAGNOSIS — I77810 Thoracic aortic ectasia: Secondary | ICD-10-CM | POA: Diagnosis not present

## 2021-12-07 DIAGNOSIS — J849 Interstitial pulmonary disease, unspecified: Secondary | ICD-10-CM | POA: Diagnosis not present

## 2021-12-07 DIAGNOSIS — J431 Panlobular emphysema: Secondary | ICD-10-CM | POA: Diagnosis not present

## 2021-12-08 DIAGNOSIS — Z902 Acquired absence of lung [part of]: Secondary | ICD-10-CM | POA: Diagnosis not present

## 2021-12-08 DIAGNOSIS — I4891 Unspecified atrial fibrillation: Secondary | ICD-10-CM | POA: Diagnosis not present

## 2021-12-08 DIAGNOSIS — J849 Interstitial pulmonary disease, unspecified: Secondary | ICD-10-CM | POA: Diagnosis not present

## 2021-12-08 DIAGNOSIS — E872 Acidosis, unspecified: Secondary | ICD-10-CM | POA: Diagnosis not present

## 2021-12-08 DIAGNOSIS — J9601 Acute respiratory failure with hypoxia: Secondary | ICD-10-CM | POA: Diagnosis not present

## 2021-12-08 DIAGNOSIS — R410 Disorientation, unspecified: Secondary | ICD-10-CM | POA: Diagnosis not present

## 2021-12-08 DIAGNOSIS — J431 Panlobular emphysema: Secondary | ICD-10-CM | POA: Diagnosis not present

## 2021-12-09 DIAGNOSIS — Z902 Acquired absence of lung [part of]: Secondary | ICD-10-CM | POA: Diagnosis not present

## 2021-12-09 DIAGNOSIS — I4891 Unspecified atrial fibrillation: Secondary | ICD-10-CM | POA: Diagnosis not present

## 2021-12-09 DIAGNOSIS — J431 Panlobular emphysema: Secondary | ICD-10-CM | POA: Diagnosis not present

## 2021-12-09 DIAGNOSIS — J849 Interstitial pulmonary disease, unspecified: Secondary | ICD-10-CM | POA: Diagnosis not present

## 2021-12-09 DIAGNOSIS — J9601 Acute respiratory failure with hypoxia: Secondary | ICD-10-CM | POA: Diagnosis not present

## 2021-12-10 DIAGNOSIS — J431 Panlobular emphysema: Secondary | ICD-10-CM | POA: Diagnosis not present

## 2021-12-10 DIAGNOSIS — J9601 Acute respiratory failure with hypoxia: Secondary | ICD-10-CM | POA: Diagnosis not present

## 2021-12-10 DIAGNOSIS — I4891 Unspecified atrial fibrillation: Secondary | ICD-10-CM | POA: Diagnosis not present

## 2021-12-10 DIAGNOSIS — Z902 Acquired absence of lung [part of]: Secondary | ICD-10-CM | POA: Diagnosis not present

## 2021-12-10 DIAGNOSIS — J849 Interstitial pulmonary disease, unspecified: Secondary | ICD-10-CM | POA: Diagnosis not present

## 2021-12-20 DIAGNOSIS — J9621 Acute and chronic respiratory failure with hypoxia: Secondary | ICD-10-CM | POA: Diagnosis not present

## 2021-12-20 DIAGNOSIS — I10 Essential (primary) hypertension: Secondary | ICD-10-CM | POA: Diagnosis not present

## 2021-12-20 DIAGNOSIS — N179 Acute kidney failure, unspecified: Secondary | ICD-10-CM | POA: Diagnosis not present

## 2021-12-20 DIAGNOSIS — A409 Streptococcal sepsis, unspecified: Secondary | ICD-10-CM | POA: Diagnosis not present

## 2021-12-20 DIAGNOSIS — I4891 Unspecified atrial fibrillation: Secondary | ICD-10-CM | POA: Diagnosis not present

## 2021-12-22 DIAGNOSIS — I4891 Unspecified atrial fibrillation: Secondary | ICD-10-CM | POA: Diagnosis not present

## 2021-12-22 DIAGNOSIS — I10 Essential (primary) hypertension: Secondary | ICD-10-CM | POA: Diagnosis not present

## 2021-12-22 DIAGNOSIS — J9621 Acute and chronic respiratory failure with hypoxia: Secondary | ICD-10-CM | POA: Diagnosis not present

## 2021-12-22 DIAGNOSIS — N179 Acute kidney failure, unspecified: Secondary | ICD-10-CM | POA: Diagnosis not present

## 2021-12-22 DIAGNOSIS — A409 Streptococcal sepsis, unspecified: Secondary | ICD-10-CM | POA: Diagnosis not present

## 2021-12-26 DIAGNOSIS — I4891 Unspecified atrial fibrillation: Secondary | ICD-10-CM | POA: Diagnosis not present

## 2021-12-26 DIAGNOSIS — J849 Interstitial pulmonary disease, unspecified: Secondary | ICD-10-CM | POA: Diagnosis not present

## 2021-12-26 DIAGNOSIS — J449 Chronic obstructive pulmonary disease, unspecified: Secondary | ICD-10-CM | POA: Diagnosis not present

## 2021-12-26 DIAGNOSIS — I10 Essential (primary) hypertension: Secondary | ICD-10-CM | POA: Diagnosis not present

## 2021-12-26 DIAGNOSIS — J9601 Acute respiratory failure with hypoxia: Secondary | ICD-10-CM | POA: Diagnosis not present

## 2021-12-26 DIAGNOSIS — Z09 Encounter for follow-up examination after completed treatment for conditions other than malignant neoplasm: Secondary | ICD-10-CM | POA: Diagnosis not present

## 2021-12-27 DIAGNOSIS — I10 Essential (primary) hypertension: Secondary | ICD-10-CM | POA: Diagnosis not present

## 2021-12-27 DIAGNOSIS — J9621 Acute and chronic respiratory failure with hypoxia: Secondary | ICD-10-CM | POA: Diagnosis not present

## 2021-12-27 DIAGNOSIS — A409 Streptococcal sepsis, unspecified: Secondary | ICD-10-CM | POA: Diagnosis not present

## 2021-12-27 DIAGNOSIS — N179 Acute kidney failure, unspecified: Secondary | ICD-10-CM | POA: Diagnosis not present

## 2021-12-27 DIAGNOSIS — I4891 Unspecified atrial fibrillation: Secondary | ICD-10-CM | POA: Diagnosis not present

## 2021-12-28 DIAGNOSIS — N179 Acute kidney failure, unspecified: Secondary | ICD-10-CM | POA: Diagnosis not present

## 2021-12-28 DIAGNOSIS — A409 Streptococcal sepsis, unspecified: Secondary | ICD-10-CM | POA: Diagnosis not present

## 2021-12-28 DIAGNOSIS — J9621 Acute and chronic respiratory failure with hypoxia: Secondary | ICD-10-CM | POA: Diagnosis not present

## 2021-12-28 DIAGNOSIS — D649 Anemia, unspecified: Secondary | ICD-10-CM | POA: Diagnosis not present

## 2021-12-28 DIAGNOSIS — I10 Essential (primary) hypertension: Secondary | ICD-10-CM | POA: Diagnosis not present

## 2021-12-28 DIAGNOSIS — I4891 Unspecified atrial fibrillation: Secondary | ICD-10-CM | POA: Diagnosis not present

## 2021-12-29 DIAGNOSIS — I4891 Unspecified atrial fibrillation: Secondary | ICD-10-CM | POA: Diagnosis not present

## 2021-12-29 DIAGNOSIS — J9621 Acute and chronic respiratory failure with hypoxia: Secondary | ICD-10-CM | POA: Diagnosis not present

## 2021-12-29 DIAGNOSIS — N179 Acute kidney failure, unspecified: Secondary | ICD-10-CM | POA: Diagnosis not present

## 2021-12-29 DIAGNOSIS — A409 Streptococcal sepsis, unspecified: Secondary | ICD-10-CM | POA: Diagnosis not present

## 2021-12-29 DIAGNOSIS — I10 Essential (primary) hypertension: Secondary | ICD-10-CM | POA: Diagnosis not present

## 2021-12-30 DIAGNOSIS — N179 Acute kidney failure, unspecified: Secondary | ICD-10-CM | POA: Diagnosis not present

## 2021-12-30 DIAGNOSIS — I10 Essential (primary) hypertension: Secondary | ICD-10-CM | POA: Diagnosis not present

## 2021-12-30 DIAGNOSIS — A409 Streptococcal sepsis, unspecified: Secondary | ICD-10-CM | POA: Diagnosis not present

## 2021-12-30 DIAGNOSIS — I4891 Unspecified atrial fibrillation: Secondary | ICD-10-CM | POA: Diagnosis not present

## 2021-12-30 DIAGNOSIS — J9621 Acute and chronic respiratory failure with hypoxia: Secondary | ICD-10-CM | POA: Diagnosis not present

## 2022-01-03 DIAGNOSIS — N179 Acute kidney failure, unspecified: Secondary | ICD-10-CM | POA: Diagnosis not present

## 2022-01-03 DIAGNOSIS — A409 Streptococcal sepsis, unspecified: Secondary | ICD-10-CM | POA: Diagnosis not present

## 2022-01-03 DIAGNOSIS — J9621 Acute and chronic respiratory failure with hypoxia: Secondary | ICD-10-CM | POA: Diagnosis not present

## 2022-01-03 DIAGNOSIS — I10 Essential (primary) hypertension: Secondary | ICD-10-CM | POA: Diagnosis not present

## 2022-01-03 DIAGNOSIS — I4891 Unspecified atrial fibrillation: Secondary | ICD-10-CM | POA: Diagnosis not present

## 2022-01-04 DIAGNOSIS — I10 Essential (primary) hypertension: Secondary | ICD-10-CM | POA: Diagnosis not present

## 2022-01-04 DIAGNOSIS — N179 Acute kidney failure, unspecified: Secondary | ICD-10-CM | POA: Diagnosis not present

## 2022-01-04 DIAGNOSIS — I4891 Unspecified atrial fibrillation: Secondary | ICD-10-CM | POA: Diagnosis not present

## 2022-01-04 DIAGNOSIS — J9621 Acute and chronic respiratory failure with hypoxia: Secondary | ICD-10-CM | POA: Diagnosis not present

## 2022-01-04 DIAGNOSIS — A409 Streptococcal sepsis, unspecified: Secondary | ICD-10-CM | POA: Diagnosis not present

## 2022-01-05 DIAGNOSIS — E785 Hyperlipidemia, unspecified: Secondary | ICD-10-CM | POA: Diagnosis not present

## 2022-01-05 DIAGNOSIS — K648 Other hemorrhoids: Secondary | ICD-10-CM | POA: Diagnosis not present

## 2022-01-05 DIAGNOSIS — K5791 Diverticulosis of intestine, part unspecified, without perforation or abscess with bleeding: Secondary | ICD-10-CM | POA: Diagnosis not present

## 2022-01-05 DIAGNOSIS — Z87891 Personal history of nicotine dependence: Secondary | ICD-10-CM | POA: Diagnosis not present

## 2022-01-05 DIAGNOSIS — J9611 Chronic respiratory failure with hypoxia: Secondary | ICD-10-CM | POA: Diagnosis not present

## 2022-01-05 DIAGNOSIS — I129 Hypertensive chronic kidney disease with stage 1 through stage 4 chronic kidney disease, or unspecified chronic kidney disease: Secondary | ICD-10-CM | POA: Diagnosis not present

## 2022-01-05 DIAGNOSIS — K2971 Gastritis, unspecified, with bleeding: Secondary | ICD-10-CM | POA: Diagnosis not present

## 2022-01-05 DIAGNOSIS — K922 Gastrointestinal hemorrhage, unspecified: Secondary | ICD-10-CM | POA: Diagnosis not present

## 2022-01-05 DIAGNOSIS — R0689 Other abnormalities of breathing: Secondary | ICD-10-CM | POA: Diagnosis not present

## 2022-01-05 DIAGNOSIS — K2970 Gastritis, unspecified, without bleeding: Secondary | ICD-10-CM | POA: Diagnosis not present

## 2022-01-05 DIAGNOSIS — K921 Melena: Secondary | ICD-10-CM | POA: Diagnosis not present

## 2022-01-05 DIAGNOSIS — Z538 Procedure and treatment not carried out for other reasons: Secondary | ICD-10-CM | POA: Diagnosis not present

## 2022-01-05 DIAGNOSIS — Z86010 Personal history of colonic polyps: Secondary | ICD-10-CM | POA: Diagnosis not present

## 2022-01-05 DIAGNOSIS — K5731 Diverticulosis of large intestine without perforation or abscess with bleeding: Secondary | ICD-10-CM | POA: Diagnosis not present

## 2022-01-05 DIAGNOSIS — Z9981 Dependence on supplemental oxygen: Secondary | ICD-10-CM | POA: Diagnosis not present

## 2022-01-05 DIAGNOSIS — I4891 Unspecified atrial fibrillation: Secondary | ICD-10-CM | POA: Diagnosis not present

## 2022-01-05 DIAGNOSIS — R710 Precipitous drop in hematocrit: Secondary | ICD-10-CM | POA: Diagnosis not present

## 2022-01-05 DIAGNOSIS — Z7901 Long term (current) use of anticoagulants: Secondary | ICD-10-CM | POA: Diagnosis not present

## 2022-01-05 DIAGNOSIS — G629 Polyneuropathy, unspecified: Secondary | ICD-10-CM | POA: Diagnosis not present

## 2022-01-05 DIAGNOSIS — I482 Chronic atrial fibrillation, unspecified: Secondary | ICD-10-CM | POA: Diagnosis not present

## 2022-01-05 DIAGNOSIS — N183 Chronic kidney disease, stage 3 unspecified: Secondary | ICD-10-CM | POA: Diagnosis not present

## 2022-01-05 DIAGNOSIS — Z743 Need for continuous supervision: Secondary | ICD-10-CM | POA: Diagnosis not present

## 2022-01-05 DIAGNOSIS — I10 Essential (primary) hypertension: Secondary | ICD-10-CM | POA: Diagnosis not present

## 2022-01-05 DIAGNOSIS — R6889 Other general symptoms and signs: Secondary | ICD-10-CM | POA: Diagnosis not present

## 2022-01-05 DIAGNOSIS — R0602 Shortness of breath: Secondary | ICD-10-CM | POA: Diagnosis not present

## 2022-01-05 DIAGNOSIS — Z79899 Other long term (current) drug therapy: Secondary | ICD-10-CM | POA: Diagnosis not present

## 2022-01-05 DIAGNOSIS — Z8673 Personal history of transient ischemic attack (TIA), and cerebral infarction without residual deficits: Secondary | ICD-10-CM | POA: Diagnosis not present

## 2022-01-05 DIAGNOSIS — R531 Weakness: Secondary | ICD-10-CM | POA: Diagnosis not present

## 2022-01-05 DIAGNOSIS — Z8619 Personal history of other infectious and parasitic diseases: Secondary | ICD-10-CM | POA: Diagnosis not present

## 2022-01-05 DIAGNOSIS — D649 Anemia, unspecified: Secondary | ICD-10-CM | POA: Diagnosis not present

## 2022-01-05 DIAGNOSIS — Z85118 Personal history of other malignant neoplasm of bronchus and lung: Secondary | ICD-10-CM | POA: Diagnosis not present

## 2022-01-05 DIAGNOSIS — D62 Acute posthemorrhagic anemia: Secondary | ICD-10-CM | POA: Diagnosis not present

## 2022-01-05 DIAGNOSIS — K5790 Diverticulosis of intestine, part unspecified, without perforation or abscess without bleeding: Secondary | ICD-10-CM | POA: Diagnosis not present

## 2022-01-05 DIAGNOSIS — K31A19 Gastric intestinal metaplasia without dysplasia, unspecified site: Secondary | ICD-10-CM | POA: Diagnosis not present

## 2022-01-05 DIAGNOSIS — R918 Other nonspecific abnormal finding of lung field: Secondary | ICD-10-CM | POA: Diagnosis not present

## 2022-01-05 DIAGNOSIS — K5900 Constipation, unspecified: Secondary | ICD-10-CM | POA: Diagnosis not present

## 2022-01-05 DIAGNOSIS — J849 Interstitial pulmonary disease, unspecified: Secondary | ICD-10-CM | POA: Diagnosis not present

## 2022-01-05 DIAGNOSIS — J449 Chronic obstructive pulmonary disease, unspecified: Secondary | ICD-10-CM | POA: Diagnosis not present

## 2022-01-11 DIAGNOSIS — I4891 Unspecified atrial fibrillation: Secondary | ICD-10-CM | POA: Diagnosis not present

## 2022-01-11 DIAGNOSIS — I10 Essential (primary) hypertension: Secondary | ICD-10-CM | POA: Diagnosis not present

## 2022-01-11 DIAGNOSIS — N179 Acute kidney failure, unspecified: Secondary | ICD-10-CM | POA: Diagnosis not present

## 2022-01-11 DIAGNOSIS — A409 Streptococcal sepsis, unspecified: Secondary | ICD-10-CM | POA: Diagnosis not present

## 2022-01-11 DIAGNOSIS — J9621 Acute and chronic respiratory failure with hypoxia: Secondary | ICD-10-CM | POA: Diagnosis not present

## 2022-01-13 DIAGNOSIS — A409 Streptococcal sepsis, unspecified: Secondary | ICD-10-CM | POA: Diagnosis not present

## 2022-01-13 DIAGNOSIS — N179 Acute kidney failure, unspecified: Secondary | ICD-10-CM | POA: Diagnosis not present

## 2022-01-13 DIAGNOSIS — J9621 Acute and chronic respiratory failure with hypoxia: Secondary | ICD-10-CM | POA: Diagnosis not present

## 2022-01-13 DIAGNOSIS — I4891 Unspecified atrial fibrillation: Secondary | ICD-10-CM | POA: Diagnosis not present

## 2022-01-13 DIAGNOSIS — I10 Essential (primary) hypertension: Secondary | ICD-10-CM | POA: Diagnosis not present

## 2022-01-16 DIAGNOSIS — J849 Interstitial pulmonary disease, unspecified: Secondary | ICD-10-CM | POA: Diagnosis not present

## 2022-01-16 DIAGNOSIS — N183 Chronic kidney disease, stage 3 unspecified: Secondary | ICD-10-CM | POA: Diagnosis not present

## 2022-01-16 DIAGNOSIS — D62 Acute posthemorrhagic anemia: Secondary | ICD-10-CM | POA: Diagnosis not present

## 2022-01-17 DIAGNOSIS — D509 Iron deficiency anemia, unspecified: Secondary | ICD-10-CM | POA: Diagnosis not present

## 2022-01-17 DIAGNOSIS — K5520 Angiodysplasia of colon without hemorrhage: Secondary | ICD-10-CM | POA: Diagnosis not present

## 2022-01-18 DIAGNOSIS — J9621 Acute and chronic respiratory failure with hypoxia: Secondary | ICD-10-CM | POA: Diagnosis not present

## 2022-01-18 DIAGNOSIS — A409 Streptococcal sepsis, unspecified: Secondary | ICD-10-CM | POA: Diagnosis not present

## 2022-01-18 DIAGNOSIS — I4891 Unspecified atrial fibrillation: Secondary | ICD-10-CM | POA: Diagnosis not present

## 2022-01-18 DIAGNOSIS — I10 Essential (primary) hypertension: Secondary | ICD-10-CM | POA: Diagnosis not present

## 2022-01-18 DIAGNOSIS — N179 Acute kidney failure, unspecified: Secondary | ICD-10-CM | POA: Diagnosis not present

## 2022-01-19 DIAGNOSIS — N179 Acute kidney failure, unspecified: Secondary | ICD-10-CM | POA: Diagnosis not present

## 2022-01-19 DIAGNOSIS — I4891 Unspecified atrial fibrillation: Secondary | ICD-10-CM | POA: Diagnosis not present

## 2022-01-19 DIAGNOSIS — A409 Streptococcal sepsis, unspecified: Secondary | ICD-10-CM | POA: Diagnosis not present

## 2022-01-19 DIAGNOSIS — J9621 Acute and chronic respiratory failure with hypoxia: Secondary | ICD-10-CM | POA: Diagnosis not present

## 2022-01-19 DIAGNOSIS — I10 Essential (primary) hypertension: Secondary | ICD-10-CM | POA: Diagnosis not present

## 2022-01-20 DIAGNOSIS — N179 Acute kidney failure, unspecified: Secondary | ICD-10-CM | POA: Diagnosis not present

## 2022-01-20 DIAGNOSIS — J9621 Acute and chronic respiratory failure with hypoxia: Secondary | ICD-10-CM | POA: Diagnosis not present

## 2022-01-20 DIAGNOSIS — A409 Streptococcal sepsis, unspecified: Secondary | ICD-10-CM | POA: Diagnosis not present

## 2022-01-20 DIAGNOSIS — I10 Essential (primary) hypertension: Secondary | ICD-10-CM | POA: Diagnosis not present

## 2022-01-20 DIAGNOSIS — I4891 Unspecified atrial fibrillation: Secondary | ICD-10-CM | POA: Diagnosis not present

## 2022-01-24 DIAGNOSIS — A409 Streptococcal sepsis, unspecified: Secondary | ICD-10-CM | POA: Diagnosis not present

## 2022-01-24 DIAGNOSIS — I4891 Unspecified atrial fibrillation: Secondary | ICD-10-CM | POA: Diagnosis not present

## 2022-01-24 DIAGNOSIS — N179 Acute kidney failure, unspecified: Secondary | ICD-10-CM | POA: Diagnosis not present

## 2022-01-24 DIAGNOSIS — J9621 Acute and chronic respiratory failure with hypoxia: Secondary | ICD-10-CM | POA: Diagnosis not present

## 2022-01-24 DIAGNOSIS — I10 Essential (primary) hypertension: Secondary | ICD-10-CM | POA: Diagnosis not present

## 2022-01-25 DIAGNOSIS — A409 Streptococcal sepsis, unspecified: Secondary | ICD-10-CM | POA: Diagnosis not present

## 2022-01-25 DIAGNOSIS — J9621 Acute and chronic respiratory failure with hypoxia: Secondary | ICD-10-CM | POA: Diagnosis not present

## 2022-01-26 DIAGNOSIS — I10 Essential (primary) hypertension: Secondary | ICD-10-CM | POA: Diagnosis not present

## 2022-01-26 DIAGNOSIS — J9621 Acute and chronic respiratory failure with hypoxia: Secondary | ICD-10-CM | POA: Diagnosis not present

## 2022-01-26 DIAGNOSIS — I4891 Unspecified atrial fibrillation: Secondary | ICD-10-CM | POA: Diagnosis not present

## 2022-01-26 DIAGNOSIS — N179 Acute kidney failure, unspecified: Secondary | ICD-10-CM | POA: Diagnosis not present

## 2022-01-26 DIAGNOSIS — A409 Streptococcal sepsis, unspecified: Secondary | ICD-10-CM | POA: Diagnosis not present

## 2022-01-30 DIAGNOSIS — I10 Essential (primary) hypertension: Secondary | ICD-10-CM | POA: Diagnosis not present

## 2022-01-30 DIAGNOSIS — A409 Streptococcal sepsis, unspecified: Secondary | ICD-10-CM | POA: Diagnosis not present

## 2022-01-30 DIAGNOSIS — J9621 Acute and chronic respiratory failure with hypoxia: Secondary | ICD-10-CM | POA: Diagnosis not present

## 2022-01-30 DIAGNOSIS — N179 Acute kidney failure, unspecified: Secondary | ICD-10-CM | POA: Diagnosis not present

## 2022-01-30 DIAGNOSIS — I4891 Unspecified atrial fibrillation: Secondary | ICD-10-CM | POA: Diagnosis not present

## 2022-02-01 DIAGNOSIS — J849 Interstitial pulmonary disease, unspecified: Secondary | ICD-10-CM | POA: Diagnosis not present

## 2022-02-01 DIAGNOSIS — Z09 Encounter for follow-up examination after completed treatment for conditions other than malignant neoplasm: Secondary | ICD-10-CM | POA: Diagnosis not present

## 2022-02-01 DIAGNOSIS — C3432 Malignant neoplasm of lower lobe, left bronchus or lung: Secondary | ICD-10-CM | POA: Diagnosis not present

## 2022-02-01 DIAGNOSIS — F17211 Nicotine dependence, cigarettes, in remission: Secondary | ICD-10-CM | POA: Diagnosis not present

## 2022-02-01 DIAGNOSIS — Z9981 Dependence on supplemental oxygen: Secondary | ICD-10-CM | POA: Diagnosis not present

## 2022-02-01 DIAGNOSIS — J449 Chronic obstructive pulmonary disease, unspecified: Secondary | ICD-10-CM | POA: Diagnosis not present

## 2022-02-01 DIAGNOSIS — J9611 Chronic respiratory failure with hypoxia: Secondary | ICD-10-CM | POA: Diagnosis not present

## 2022-02-01 DIAGNOSIS — R0602 Shortness of breath: Secondary | ICD-10-CM | POA: Diagnosis not present

## 2022-02-01 DIAGNOSIS — D509 Iron deficiency anemia, unspecified: Secondary | ICD-10-CM | POA: Diagnosis not present

## 2022-02-03 DIAGNOSIS — I4891 Unspecified atrial fibrillation: Secondary | ICD-10-CM | POA: Diagnosis not present

## 2022-02-03 DIAGNOSIS — I10 Essential (primary) hypertension: Secondary | ICD-10-CM | POA: Diagnosis not present

## 2022-02-03 DIAGNOSIS — N179 Acute kidney failure, unspecified: Secondary | ICD-10-CM | POA: Diagnosis not present

## 2022-02-03 DIAGNOSIS — J9621 Acute and chronic respiratory failure with hypoxia: Secondary | ICD-10-CM | POA: Diagnosis not present

## 2022-02-03 DIAGNOSIS — A409 Streptococcal sepsis, unspecified: Secondary | ICD-10-CM | POA: Diagnosis not present

## 2022-02-06 DIAGNOSIS — I10 Essential (primary) hypertension: Secondary | ICD-10-CM | POA: Diagnosis not present

## 2022-02-06 DIAGNOSIS — A409 Streptococcal sepsis, unspecified: Secondary | ICD-10-CM | POA: Diagnosis not present

## 2022-02-06 DIAGNOSIS — I4891 Unspecified atrial fibrillation: Secondary | ICD-10-CM | POA: Diagnosis not present

## 2022-02-06 DIAGNOSIS — N179 Acute kidney failure, unspecified: Secondary | ICD-10-CM | POA: Diagnosis not present

## 2022-02-06 DIAGNOSIS — J9621 Acute and chronic respiratory failure with hypoxia: Secondary | ICD-10-CM | POA: Diagnosis not present

## 2022-02-08 DIAGNOSIS — J9621 Acute and chronic respiratory failure with hypoxia: Secondary | ICD-10-CM | POA: Diagnosis not present

## 2022-02-08 DIAGNOSIS — I10 Essential (primary) hypertension: Secondary | ICD-10-CM | POA: Diagnosis not present

## 2022-02-08 DIAGNOSIS — M25473 Effusion, unspecified ankle: Secondary | ICD-10-CM | POA: Diagnosis not present

## 2022-02-08 DIAGNOSIS — J431 Panlobular emphysema: Secondary | ICD-10-CM | POA: Diagnosis not present

## 2022-02-08 DIAGNOSIS — A409 Streptococcal sepsis, unspecified: Secondary | ICD-10-CM | POA: Diagnosis not present

## 2022-02-08 DIAGNOSIS — J9611 Chronic respiratory failure with hypoxia: Secondary | ICD-10-CM | POA: Diagnosis not present

## 2022-02-08 DIAGNOSIS — N179 Acute kidney failure, unspecified: Secondary | ICD-10-CM | POA: Diagnosis not present

## 2022-02-08 DIAGNOSIS — C3491 Malignant neoplasm of unspecified part of right bronchus or lung: Secondary | ICD-10-CM | POA: Diagnosis not present

## 2022-02-08 DIAGNOSIS — I4891 Unspecified atrial fibrillation: Secondary | ICD-10-CM | POA: Diagnosis not present

## 2022-02-09 DIAGNOSIS — M25473 Effusion, unspecified ankle: Secondary | ICD-10-CM | POA: Diagnosis not present

## 2022-02-09 DIAGNOSIS — M25472 Effusion, left ankle: Secondary | ICD-10-CM | POA: Diagnosis not present

## 2022-02-14 DIAGNOSIS — N179 Acute kidney failure, unspecified: Secondary | ICD-10-CM | POA: Diagnosis not present

## 2022-02-14 DIAGNOSIS — I4891 Unspecified atrial fibrillation: Secondary | ICD-10-CM | POA: Diagnosis not present

## 2022-02-14 DIAGNOSIS — A409 Streptococcal sepsis, unspecified: Secondary | ICD-10-CM | POA: Diagnosis not present

## 2022-02-14 DIAGNOSIS — I10 Essential (primary) hypertension: Secondary | ICD-10-CM | POA: Diagnosis not present

## 2022-02-14 DIAGNOSIS — J9621 Acute and chronic respiratory failure with hypoxia: Secondary | ICD-10-CM | POA: Diagnosis not present

## 2022-02-28 DIAGNOSIS — D509 Iron deficiency anemia, unspecified: Secondary | ICD-10-CM | POA: Diagnosis not present

## 2022-03-24 DIAGNOSIS — A409 Streptococcal sepsis, unspecified: Secondary | ICD-10-CM | POA: Diagnosis not present

## 2022-03-24 DIAGNOSIS — J9611 Chronic respiratory failure with hypoxia: Secondary | ICD-10-CM | POA: Diagnosis not present

## 2022-04-10 DIAGNOSIS — D509 Iron deficiency anemia, unspecified: Secondary | ICD-10-CM | POA: Diagnosis not present

## 2022-04-13 DIAGNOSIS — E538 Deficiency of other specified B group vitamins: Secondary | ICD-10-CM | POA: Diagnosis not present

## 2022-04-13 DIAGNOSIS — E785 Hyperlipidemia, unspecified: Secondary | ICD-10-CM | POA: Diagnosis not present

## 2022-04-13 DIAGNOSIS — N183 Chronic kidney disease, stage 3 unspecified: Secondary | ICD-10-CM | POA: Diagnosis not present

## 2022-04-13 DIAGNOSIS — I4891 Unspecified atrial fibrillation: Secondary | ICD-10-CM | POA: Diagnosis not present

## 2022-04-13 DIAGNOSIS — Z0000 Encounter for general adult medical examination without abnormal findings: Secondary | ICD-10-CM | POA: Diagnosis not present

## 2022-04-13 DIAGNOSIS — I714 Abdominal aortic aneurysm, without rupture, unspecified: Secondary | ICD-10-CM | POA: Diagnosis not present

## 2022-04-13 DIAGNOSIS — G63 Polyneuropathy in diseases classified elsewhere: Secondary | ICD-10-CM | POA: Diagnosis not present

## 2022-04-13 DIAGNOSIS — C3492 Malignant neoplasm of unspecified part of left bronchus or lung: Secondary | ICD-10-CM | POA: Diagnosis not present

## 2022-04-13 DIAGNOSIS — J431 Panlobular emphysema: Secondary | ICD-10-CM | POA: Diagnosis not present
